# Patient Record
Sex: Female | Born: 1955 | Race: Black or African American | Hispanic: No | Marital: Single | Smoking: Former smoker
Health system: Southern US, Community
[De-identification: ages and names within clinical notes are randomized; demographics above are authoritative.]

## PROBLEM LIST (undated history)

## (undated) DIAGNOSIS — G709 Myoneural disorder, unspecified: Secondary | ICD-10-CM

## (undated) DIAGNOSIS — T7840XA Allergy, unspecified, initial encounter: Secondary | ICD-10-CM

## (undated) DIAGNOSIS — I10 Essential (primary) hypertension: Secondary | ICD-10-CM

## (undated) DIAGNOSIS — E785 Hyperlipidemia, unspecified: Secondary | ICD-10-CM

## (undated) DIAGNOSIS — K219 Gastro-esophageal reflux disease without esophagitis: Secondary | ICD-10-CM

## (undated) HISTORY — DX: Myoneural disorder, unspecified: G70.9

## (undated) HISTORY — DX: Gastro-esophageal reflux disease without esophagitis: K21.9

## (undated) HISTORY — DX: Essential (primary) hypertension: I10

## (undated) HISTORY — DX: Allergy, unspecified, initial encounter: T78.40XA

## (undated) HISTORY — DX: Hyperlipidemia, unspecified: E78.5

---
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
## 1980-10-03 HISTORY — PX: ABDOMINAL HYSTERECTOMY: SHX81

## 1997-10-03 HISTORY — PX: UPPER GASTROINTESTINAL ENDOSCOPY: SHX188

## 1998-09-08 ENCOUNTER — Ambulatory Visit (HOSPITAL_COMMUNITY): Admission: RE | Admit: 1998-09-08 | Discharge: 1998-09-08 | Payer: Self-pay | Admitting: Internal Medicine

## 1998-09-08 ENCOUNTER — Encounter: Payer: Self-pay | Admitting: Internal Medicine

## 1998-09-16 ENCOUNTER — Ambulatory Visit (HOSPITAL_COMMUNITY): Admission: RE | Admit: 1998-09-16 | Discharge: 1998-09-16 | Payer: Self-pay | Admitting: Internal Medicine

## 1998-10-07 ENCOUNTER — Emergency Department (HOSPITAL_COMMUNITY): Admission: EM | Admit: 1998-10-07 | Discharge: 1998-10-07 | Payer: Self-pay | Admitting: Emergency Medicine

## 2003-10-04 HISTORY — PX: UPPER GASTROINTESTINAL ENDOSCOPY: SHX188

## 2005-01-07 ENCOUNTER — Ambulatory Visit: Payer: Self-pay | Admitting: Family Medicine

## 2005-02-17 ENCOUNTER — Ambulatory Visit: Payer: Self-pay | Admitting: Family Medicine

## 2005-02-24 ENCOUNTER — Ambulatory Visit: Payer: Self-pay | Admitting: Family Medicine

## 2005-04-21 ENCOUNTER — Ambulatory Visit: Payer: Self-pay | Admitting: Family Medicine

## 2005-05-12 ENCOUNTER — Encounter: Admission: RE | Admit: 2005-05-12 | Discharge: 2005-05-12 | Payer: Self-pay | Admitting: Sports Medicine

## 2006-11-30 DIAGNOSIS — I10 Essential (primary) hypertension: Secondary | ICD-10-CM

## 2006-11-30 DIAGNOSIS — K589 Irritable bowel syndrome without diarrhea: Secondary | ICD-10-CM | POA: Insufficient documentation

## 2007-01-28 ENCOUNTER — Emergency Department (HOSPITAL_COMMUNITY): Admission: EM | Admit: 2007-01-28 | Discharge: 2007-01-28 | Payer: Self-pay | Admitting: Emergency Medicine

## 2007-08-09 ENCOUNTER — Encounter: Admission: RE | Admit: 2007-08-09 | Discharge: 2007-08-09 | Payer: Self-pay | Admitting: Internal Medicine

## 2007-11-01 ENCOUNTER — Encounter: Admission: RE | Admit: 2007-11-01 | Discharge: 2007-11-01 | Payer: Self-pay | Admitting: Internal Medicine

## 2008-02-14 ENCOUNTER — Encounter: Admission: RE | Admit: 2008-02-14 | Discharge: 2008-02-14 | Payer: Self-pay | Admitting: Internal Medicine

## 2008-02-22 ENCOUNTER — Encounter: Admission: RE | Admit: 2008-02-22 | Discharge: 2008-02-22 | Payer: Self-pay | Admitting: Internal Medicine

## 2009-02-19 ENCOUNTER — Encounter: Admission: RE | Admit: 2009-02-19 | Discharge: 2009-02-19 | Payer: Self-pay | Admitting: Internal Medicine

## 2010-02-07 ENCOUNTER — Emergency Department (HOSPITAL_COMMUNITY): Admission: EM | Admit: 2010-02-07 | Discharge: 2010-02-07 | Payer: Self-pay | Admitting: Emergency Medicine

## 2010-10-03 DIAGNOSIS — G709 Myoneural disorder, unspecified: Secondary | ICD-10-CM

## 2010-10-03 HISTORY — DX: Myoneural disorder, unspecified: G70.9

## 2010-10-24 ENCOUNTER — Encounter: Payer: Self-pay | Admitting: Internal Medicine

## 2010-11-29 ENCOUNTER — Emergency Department (HOSPITAL_COMMUNITY)
Admission: EM | Admit: 2010-11-29 | Discharge: 2010-11-29 | Disposition: A | Discharge status: Home / Self Care | Payer: Self-pay | Attending: Emergency Medicine | Admitting: Emergency Medicine

## 2010-11-29 DIAGNOSIS — E119 Type 2 diabetes mellitus without complications: Secondary | ICD-10-CM | POA: Insufficient documentation

## 2010-11-29 DIAGNOSIS — Z79899 Other long term (current) drug therapy: Secondary | ICD-10-CM | POA: Insufficient documentation

## 2010-11-29 DIAGNOSIS — M5430 Sciatica, unspecified side: Secondary | ICD-10-CM | POA: Insufficient documentation

## 2010-11-29 DIAGNOSIS — I10 Essential (primary) hypertension: Secondary | ICD-10-CM | POA: Insufficient documentation

## 2010-11-29 LAB — GLUCOSE, CAPILLARY: Glucose-Capillary: 127 mg/dL — ABNORMAL HIGH (ref 70–99)

## 2010-12-21 LAB — RAPID STREP SCREEN (MED CTR MEBANE ONLY): Streptococcus, Group A Screen (Direct): NEGATIVE

## 2011-03-16 ENCOUNTER — Other Ambulatory Visit (HOSPITAL_COMMUNITY): Payer: Self-pay | Admitting: Family Medicine

## 2011-03-16 DIAGNOSIS — Z1231 Encounter for screening mammogram for malignant neoplasm of breast: Secondary | ICD-10-CM

## 2011-03-16 DIAGNOSIS — Z780 Asymptomatic menopausal state: Secondary | ICD-10-CM

## 2011-03-29 ENCOUNTER — Ambulatory Visit (HOSPITAL_COMMUNITY)
Admission: RE | Admit: 2011-03-29 | Discharge: 2011-03-29 | Disposition: A | Discharge status: Home / Self Care | Payer: Self-pay | Source: Ambulatory Visit | Attending: Family Medicine | Admitting: Family Medicine

## 2011-03-29 DIAGNOSIS — Z1231 Encounter for screening mammogram for malignant neoplasm of breast: Secondary | ICD-10-CM | POA: Insufficient documentation

## 2011-03-29 DIAGNOSIS — Z780 Asymptomatic menopausal state: Secondary | ICD-10-CM

## 2011-06-08 ENCOUNTER — Telehealth: Payer: Self-pay | Admitting: Internal Medicine

## 2011-06-08 ENCOUNTER — Ambulatory Visit (AMBULATORY_SURGERY_CENTER): Payer: Self-pay | Admitting: *Deleted

## 2011-06-08 VITALS — Ht 62.0 in | Wt 136.0 lb

## 2011-06-08 DIAGNOSIS — Z1211 Encounter for screening for malignant neoplasm of colon: Secondary | ICD-10-CM

## 2011-06-08 NOTE — Progress Notes (Signed)
Addended by: Sarina Ill ANN on: 06/08/2011 05:50 PM   Modules accepted: Orders

## 2011-06-21 ENCOUNTER — Other Ambulatory Visit: Payer: Self-pay | Admitting: Internal Medicine

## 2011-08-08 NOTE — Telephone Encounter (Signed)
Done Doristine Church reviewed meds

## 2012-10-15 ENCOUNTER — Other Ambulatory Visit: Payer: Self-pay | Admitting: Internal Medicine

## 2012-10-15 ENCOUNTER — Ambulatory Visit
Admission: RE | Admit: 2012-10-15 | Discharge: 2012-10-15 | Disposition: A | Discharge status: Home / Self Care | Payer: BC Managed Care – PPO | Source: Ambulatory Visit | Attending: Internal Medicine | Admitting: Internal Medicine

## 2012-10-15 DIAGNOSIS — M79673 Pain in unspecified foot: Secondary | ICD-10-CM

## 2013-01-14 ENCOUNTER — Other Ambulatory Visit: Payer: Self-pay | Admitting: Internal Medicine

## 2013-01-14 ENCOUNTER — Ambulatory Visit
Admission: RE | Admit: 2013-01-14 | Discharge: 2013-01-14 | Disposition: A | Discharge status: Home / Self Care | Payer: BC Managed Care – PPO | Source: Ambulatory Visit | Attending: Internal Medicine | Admitting: Internal Medicine

## 2013-01-14 DIAGNOSIS — R05 Cough: Secondary | ICD-10-CM

## 2013-03-13 ENCOUNTER — Emergency Department (HOSPITAL_COMMUNITY): Payer: BC Managed Care – PPO

## 2013-03-13 ENCOUNTER — Encounter (HOSPITAL_COMMUNITY): Payer: Self-pay | Admitting: Physical Medicine and Rehabilitation

## 2013-03-13 ENCOUNTER — Emergency Department (HOSPITAL_COMMUNITY)
Admission: EM | Admit: 2013-03-13 | Discharge: 2013-03-13 | Disposition: A | Discharge status: Home / Self Care | Payer: BC Managed Care – PPO | Attending: Emergency Medicine | Admitting: Emergency Medicine

## 2013-03-13 DIAGNOSIS — E119 Type 2 diabetes mellitus without complications: Secondary | ICD-10-CM | POA: Insufficient documentation

## 2013-03-13 DIAGNOSIS — Z79899 Other long term (current) drug therapy: Secondary | ICD-10-CM | POA: Insufficient documentation

## 2013-03-13 DIAGNOSIS — E785 Hyperlipidemia, unspecified: Secondary | ICD-10-CM | POA: Insufficient documentation

## 2013-03-13 DIAGNOSIS — S298XXA Other specified injuries of thorax, initial encounter: Secondary | ICD-10-CM | POA: Insufficient documentation

## 2013-03-13 DIAGNOSIS — IMO0002 Reserved for concepts with insufficient information to code with codable children: Secondary | ICD-10-CM | POA: Insufficient documentation

## 2013-03-13 DIAGNOSIS — Y9389 Activity, other specified: Secondary | ICD-10-CM | POA: Insufficient documentation

## 2013-03-13 DIAGNOSIS — R0789 Other chest pain: Secondary | ICD-10-CM

## 2013-03-13 DIAGNOSIS — I10 Essential (primary) hypertension: Secondary | ICD-10-CM | POA: Insufficient documentation

## 2013-03-13 DIAGNOSIS — Z87891 Personal history of nicotine dependence: Secondary | ICD-10-CM | POA: Insufficient documentation

## 2013-03-13 DIAGNOSIS — T148XXA Other injury of unspecified body region, initial encounter: Secondary | ICD-10-CM

## 2013-03-13 DIAGNOSIS — Y92410 Unspecified street and highway as the place of occurrence of the external cause: Secondary | ICD-10-CM | POA: Insufficient documentation

## 2013-03-13 DIAGNOSIS — Z8669 Personal history of other diseases of the nervous system and sense organs: Secondary | ICD-10-CM | POA: Insufficient documentation

## 2013-03-13 MED ORDER — IBUPROFEN 800 MG PO TABS: 800.0000 mg | ORAL_TABLET | Freq: Three times a day (TID) | ORAL | Status: DC

## 2013-03-13 MED ORDER — METHOCARBAMOL 500 MG PO TABS: 500.0000 mg | ORAL_TABLET | Freq: Two times a day (BID) | ORAL | Status: DC
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
## 2013-03-13 NOTE — ED Provider Notes (Signed)
  Medical screening examination/treatment/procedure(s) were performed by non-physician practitioner and as supervising physician I was immediately available for consultation/collaboration.   Gerhard Munch, MD 03/13/13 [phone]
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
## 2013-08-12 ENCOUNTER — Ambulatory Visit
Admission: RE | Admit: 2013-08-12 | Discharge: 2013-08-12 | Disposition: A | Discharge status: Home / Self Care | Payer: BC Managed Care – PPO | Source: Ambulatory Visit | Attending: Internal Medicine | Admitting: Internal Medicine

## 2013-08-12 ENCOUNTER — Other Ambulatory Visit: Payer: Self-pay | Admitting: Internal Medicine

## 2015-02-13 ENCOUNTER — Other Ambulatory Visit (HOSPITAL_COMMUNITY): Payer: Self-pay | Admitting: Internal Medicine

## 2015-02-13 DIAGNOSIS — E0591 Thyrotoxicosis, unspecified with thyrotoxic crisis or storm: Secondary | ICD-10-CM

## 2015-03-09 ENCOUNTER — Encounter (HOSPITAL_COMMUNITY)
Admission: RE | Admit: 2015-03-09 | Discharge: 2015-03-09 | Disposition: A | Discharge status: Home / Self Care | Payer: BLUE CROSS/BLUE SHIELD | Source: Ambulatory Visit | Attending: Internal Medicine | Admitting: Internal Medicine

## 2015-03-09 DIAGNOSIS — E0590 Thyrotoxicosis, unspecified without thyrotoxic crisis or storm: Secondary | ICD-10-CM | POA: Insufficient documentation

## 2015-03-09 DIAGNOSIS — E0591 Thyrotoxicosis, unspecified with thyrotoxic crisis or storm: Secondary | ICD-10-CM

## 2015-03-09 MED ORDER — SODIUM IODIDE I 131 CAPSULE
15.0000 | Freq: Once | INTRAVENOUS | Status: AC | PRN
  Administered 2015-03-09: 15 via ORAL

## 2015-03-10 ENCOUNTER — Encounter (HOSPITAL_COMMUNITY)
Admission: RE | Admit: 2015-03-10 | Discharge: 2015-03-10 | Disposition: A | Discharge status: Home / Self Care | Payer: BLUE CROSS/BLUE SHIELD | Source: Ambulatory Visit | Attending: Internal Medicine | Admitting: Internal Medicine

## 2015-03-10 DIAGNOSIS — E0590 Thyrotoxicosis, unspecified without thyrotoxic crisis or storm: Secondary | ICD-10-CM | POA: Diagnosis not present

## 2015-03-10 MED ORDER — SODIUM PERTECHNETATE TC 99M INJECTION
10.0000 | Freq: Once | INTRAVENOUS | Status: AC | PRN
  Administered 2015-03-10: 10 via INTRAVENOUS

## 2015-09-02 ENCOUNTER — Other Ambulatory Visit: Payer: Self-pay

## 2015-09-02 DIAGNOSIS — Z1231 Encounter for screening mammogram for malignant neoplasm of breast: Secondary | ICD-10-CM

## 2015-09-07 ENCOUNTER — Encounter: Payer: Self-pay | Admitting: Gastroenterology

## 2015-09-24 ENCOUNTER — Ambulatory Visit: Payer: BLUE CROSS/BLUE SHIELD

## 2015-10-12 ENCOUNTER — Ambulatory Visit: Payer: BLUE CROSS/BLUE SHIELD

## 2015-11-03 ENCOUNTER — Encounter: Payer: BLUE CROSS/BLUE SHIELD | Admitting: Gastroenterology

## 2015-11-09 ENCOUNTER — Ambulatory Visit
Admission: RE | Admit: 2015-11-09 | Discharge: 2015-11-09 | Disposition: A | Discharge status: Home / Self Care | Payer: BLUE CROSS/BLUE SHIELD | Source: Ambulatory Visit

## 2015-11-09 DIAGNOSIS — Z1231 Encounter for screening mammogram for malignant neoplasm of breast: Secondary | ICD-10-CM

## 2015-11-16 ENCOUNTER — Ambulatory Visit (AMBULATORY_SURGERY_CENTER): Payer: Self-pay

## 2015-11-16 VITALS — Ht 62.0 in | Wt 123.0 lb

## 2015-11-16 DIAGNOSIS — Z1211 Encounter for screening for malignant neoplasm of colon: Secondary | ICD-10-CM

## 2015-11-16 MED ORDER — NA SULFATE-K SULFATE-MG SULF 17.5-3.13-1.6 GM/177ML PO SOLN: 1.0000 | Freq: Once | ORAL | Status: DC

## 2015-11-16 NOTE — Progress Notes (Signed)
No egg or soy allergies Not on home 02 No previous anesthesia complications No diet or weight loss meds 

## 2015-11-17 ENCOUNTER — Encounter: Payer: Self-pay | Admitting: Internal Medicine

## 2015-12-01 ENCOUNTER — Encounter: Payer: Self-pay | Admitting: Gastroenterology

## 2015-12-01 ENCOUNTER — Ambulatory Visit (AMBULATORY_SURGERY_CENTER): Payer: BLUE CROSS/BLUE SHIELD | Admitting: Gastroenterology

## 2015-12-01 VITALS — BP 144/86 | HR 88 | Temp 97.1°F | Resp 18 | Ht 62.0 in | Wt 123.0 lb

## 2015-12-01 DIAGNOSIS — D125 Benign neoplasm of sigmoid colon: Secondary | ICD-10-CM | POA: Diagnosis not present

## 2015-12-01 DIAGNOSIS — Z1211 Encounter for screening for malignant neoplasm of colon: Secondary | ICD-10-CM | POA: Diagnosis not present

## 2015-12-01 DIAGNOSIS — K635 Polyp of colon: Secondary | ICD-10-CM | POA: Diagnosis not present

## 2015-12-01 DIAGNOSIS — D123 Benign neoplasm of transverse colon: Secondary | ICD-10-CM

## 2015-12-01 HISTORY — PX: COLONOSCOPY: SHX174

## 2015-12-01 LAB — GLUCOSE, CAPILLARY
Glucose-Capillary: 77 mg/dL (ref 65–99)
Glucose-Capillary: 138 mg/dL — ABNORMAL HIGH (ref 65–99)
Glucose-Capillary: 87 mg/dL (ref 65–99)

## 2015-12-01 MED ORDER — SODIUM CHLORIDE 0.9 % IV SOLN: 500.0000 mL | INTRAVENOUS | Status: DC

## 2015-12-01 NOTE — Op Note (Signed)
[facility]  [address]. [place], [address]   COLONOSCOPY PROCEDURE REPORT  PATIENT: Rebecca, Glover  MR#: [id number] BIRTHDATE: 04-Feb-1956 , 85  yrs. old GENDER: female ENDOSCOPIST: Harl Bowie, MD REFERRED [id number] Rollene Rotunda MD PROCEDURE DATE:  12/01/2015 PROCEDURE:   Colonoscopy, screening, Colonoscopy with snare polypectomy, and Colonoscopy with cold biopsy polypectomy First Screening Colonoscopy - Avg.  risk and is 50 yrs.  old or older Yes.  Prior Negative Screening - Now for repeat screening. N/A  History of Adenoma - Now for follow-up colonoscopy & has been > or = to 3 yrs.  N/A  Polyps removed today? Yes ASA CLASS:   Class II INDICATIONS:Screening for colonic neoplasia and Colorectal Neoplasm Risk Assessment for this procedure is average risk. MEDICATIONS: Propofol 200 mg IV  DESCRIPTION OF PROCEDURE:   After the risks benefits and alternatives of the procedure were thoroughly explained, informed consent was obtained.  The digital rectal exam revealed no abnormalities of the rectum.   The LB [id number]  endoscope was introduced through the anus and advanced to the cecum, which was identified by both the appendix and ileocecal valve. No adverse events experienced.   The quality of the prep was good.  The instrument was then slowly withdrawn as the colon was fully examined. Estimated blood loss is zero unless otherwise noted in this procedure report.  COLON FINDINGS: A sessile polyp ranging between 3-53mm in size was found at the hepatic flexure.  A polypectomy was performed with cold forceps.  The resection was complete, the polyp tissue was completely retrieved and sent to histology.   A sessile polyp ranging between 5-74mm in size was found in the sigmoid colon.  A polypectomy was performed with a cold snare.  The resection was complete, the polyp tissue was completely retrieved and sent to histology.   There was mild diverticulosis noted  throughout the entire examined colon.  Retroflexed views revealed internal hemorrhoids. The time to cecum = 5.1 Withdrawal time = 13.4   The scope was withdrawn and the procedure completed. COMPLICATIONS: There were no immediate complications.  ENDOSCOPIC IMPRESSION: 1.   Sessile polyp ranging between 3-39mm in size was found at the hepatic flexure; polypectomy was performed with cold forceps 2.   Sessile polyp ranging between 5-72mm in size was found in the sigmoid colon; polypectomy was performed with a cold snare 3.   Mild diverticulosis was noted throughout the entire examined colon  RECOMMENDATIONS: 1.  Await pathology results 2.  If the polyp(s) removed today are proven to be adenomatous (pre-cancerous) polyps, you will need a repeat colonoscopy in 5 years.  Otherwise you should continue to follow colorectal cancer screening guidelines for "routine risk" patients with colonoscopy in 10 years.  You will receive a letter within 1-2 weeks with the results of your biopsy as well as final recommendations.  Please call my office if you have not received a letter after 3 weeks. eSigned:  Harl Bowie, MD 12/01/2015 2:31 PM      PATIENT NAME:  Rebecca, Glover MR#: [id number]

## 2015-12-01 NOTE — Patient Instructions (Signed)
YOU HAD AN ENDOSCOPIC PROCEDURE TODAY AT [facility]:   Refer to the procedure report that was given to you for any specific questions about what was found during the examination.  If the procedure report does not answer your questions, please call your gastroenterologist to clarify.  If you requested that your care partner not be given the details of your procedure findings, then the procedure report has been included in a sealed envelope for you to review at your convenience later.  YOU SHOULD EXPECT: Some feelings of bloating in the abdomen. Passage of more gas than usual.  Walking can help get rid of the air that was put into your GI tract during the procedure and reduce the bloating. If you had a lower endoscopy (such as a colonoscopy or flexible sigmoidoscopy) you may notice spotting of blood in your stool or on the toilet paper. If you underwent a bowel prep for your procedure, you may not have a normal bowel movement for a few days.  Please Note:  You might notice some irritation and congestion in your nose or some drainage.  This is from the oxygen used during your procedure.  There is no need for concern and it should clear up in a day or so.  SYMPTOMS TO REPORT IMMEDIATELY:   Following lower endoscopy (colonoscopy or flexible sigmoidoscopy):  Excessive amounts of blood in the stool  Significant tenderness or worsening of abdominal pains  Swelling of the abdomen that is new, acute  Fever of 100F or higher   For urgent or emergent issues, a gastroenterologist can be reached at any hour by calling [phone].   DIET: Your first meal following the procedure should be a small meal and then it is ok to progress to your normal diet. Heavy or fried foods are harder to digest and may make you feel nauseous or bloated.  Likewise, meals heavy in dairy and vegetables can increase bloating.  Drink plenty of fluids but you should avoid alcoholic beverages for 24  hours.  ACTIVITY:  You should plan to take it easy for the rest of today and you should NOT DRIVE or use heavy machinery until tomorrow (because of the sedation medicines used during the test).    FOLLOW UP: Our staff will call the number listed on your records the next business day following your procedure to check on you and address any questions or concerns that you may have regarding the information given to you following your procedure. If we do not reach you, we will leave a message.  However, if you are feeling well and you are not experiencing any problems, there is no need to return our call.  We will assume that you have returned to your regular daily activities without incident.  If any biopsies were taken you will be contacted by phone or by letter within the next 1-3 weeks.  Please call us at [phone] if you have not heard about the biopsies in 3 weeks.    SIGNATURES/CONFIDENTIALITY: You and/or your care partner have signed paperwork which will be entered into your electronic medical record.  These signatures attest to the fact that that the information above on your After Visit Summary has been reviewed and is understood.  Full responsibility of the confidentiality of this discharge information lies with you and/or your care-partner.  Polyp/ diverticulosis handout given Await pathology results Resume medications and diet today

## 2015-12-01 NOTE — Progress Notes (Signed)
Patient's blood glucose on admission 77. Only symptom was a headache. Iv started and D5W started. RE checked blood glucose after 15 minutes blood sugar 138. D5W stopped and NS at [facility]. Patient stating her headache is improved.

## 2015-12-01 NOTE — Progress Notes (Signed)
Called to room to assist during endoscopic procedure.  Patient ID and intended procedure confirmed with present staff. Received instructions for my participation in the procedure from the performing physician.  

## 2015-12-01 NOTE — Progress Notes (Signed)
A/ox3 pleased with MAC, report to Jill RN 

## 2015-12-02 ENCOUNTER — Telehealth: Payer: Self-pay

## 2015-12-02 NOTE — Telephone Encounter (Signed)
  Follow up Call-  Call back number 12/01/2015  Post procedure Call Back phone  # [phone]  Permission to leave phone message Yes     Patient questions:  Do you have a fever, pain , or abdominal swelling? No. Pain Score  0 *  Have you tolerated food without any problems? Yes.    Have you been able to return to your normal activities? Yes.    Do you have any questions about your discharge instructions: Diet   No. Medications  No. Follow up visit  No.  Do you have questions or concerns about your Care? No.  Actions: * If pain score is 4 or above: No action needed, pain <4.

## 2015-12-08 ENCOUNTER — Encounter: Payer: Self-pay | Admitting: Gastroenterology

## 2016-11-10 ENCOUNTER — Other Ambulatory Visit: Payer: Self-pay | Admitting: Internal Medicine

## 2016-11-10 DIAGNOSIS — Z1231 Encounter for screening mammogram for malignant neoplasm of breast: Secondary | ICD-10-CM

## 2016-11-21 ENCOUNTER — Ambulatory Visit
Admission: RE | Admit: 2016-11-21 | Discharge: 2016-11-21 | Disposition: A | Discharge status: Home / Self Care | Payer: BLUE CROSS/BLUE SHIELD | Source: Ambulatory Visit | Attending: Internal Medicine | Admitting: Internal Medicine

## 2016-11-21 DIAGNOSIS — Z1231 Encounter for screening mammogram for malignant neoplasm of breast: Secondary | ICD-10-CM

## 2017-11-30 ENCOUNTER — Other Ambulatory Visit: Payer: Self-pay | Admitting: Internal Medicine

## 2017-11-30 DIAGNOSIS — Z1231 Encounter for screening mammogram for malignant neoplasm of breast: Secondary | ICD-10-CM

## 2017-12-20 ENCOUNTER — Ambulatory Visit
Admission: RE | Admit: 2017-12-20 | Discharge: 2017-12-20 | Disposition: A | Discharge status: Home / Self Care | Payer: BLUE CROSS/BLUE SHIELD | Source: Ambulatory Visit | Attending: Internal Medicine | Admitting: Internal Medicine

## 2017-12-20 DIAGNOSIS — Z1231 Encounter for screening mammogram for malignant neoplasm of breast: Secondary | ICD-10-CM

## 2018-11-14 ENCOUNTER — Other Ambulatory Visit: Payer: Self-pay | Admitting: Internal Medicine

## 2018-11-14 DIAGNOSIS — Z1231 Encounter for screening mammogram for malignant neoplasm of breast: Secondary | ICD-10-CM

## 2019-01-07 ENCOUNTER — Ambulatory Visit: Payer: BLUE CROSS/BLUE SHIELD

## 2019-02-11 ENCOUNTER — Ambulatory Visit
Admission: RE | Admit: 2019-02-11 | Discharge: 2019-02-11 | Disposition: A | Discharge status: Home / Self Care | Payer: BLUE CROSS/BLUE SHIELD | Source: Ambulatory Visit | Attending: Internal Medicine | Admitting: Internal Medicine

## 2019-02-11 ENCOUNTER — Other Ambulatory Visit: Payer: Self-pay

## 2019-02-11 DIAGNOSIS — Z1231 Encounter for screening mammogram for malignant neoplasm of breast: Secondary | ICD-10-CM

## 2019-02-18 ENCOUNTER — Ambulatory Visit: Payer: BLUE CROSS/BLUE SHIELD

## 2020-01-20 ENCOUNTER — Other Ambulatory Visit: Payer: Self-pay | Admitting: Internal Medicine

## 2020-01-20 DIAGNOSIS — Z1231 Encounter for screening mammogram for malignant neoplasm of breast: Secondary | ICD-10-CM

## 2020-02-17 ENCOUNTER — Ambulatory Visit: Payer: BLUE CROSS/BLUE SHIELD

## 2021-03-20 ENCOUNTER — Emergency Department (HOSPITAL_COMMUNITY): Payer: Medicare Other

## 2021-03-20 ENCOUNTER — Emergency Department (HOSPITAL_COMMUNITY)
Admission: EM | Admit: 2021-03-20 | Discharge: 2021-03-20 | Disposition: A | Discharge status: Home / Self Care | Payer: Medicare Other | Attending: Emergency Medicine | Admitting: Emergency Medicine

## 2021-03-20 ENCOUNTER — Encounter (HOSPITAL_COMMUNITY): Payer: Self-pay

## 2021-03-20 ENCOUNTER — Other Ambulatory Visit: Payer: Self-pay

## 2021-03-20 DIAGNOSIS — S99192A Other physeal fracture of left metatarsal, initial encounter for closed fracture: Secondary | ICD-10-CM

## 2021-03-20 DIAGNOSIS — Z79899 Other long term (current) drug therapy: Secondary | ICD-10-CM | POA: Diagnosis not present

## 2021-03-20 DIAGNOSIS — X501XXA Overexertion from prolonged static or awkward postures, initial encounter: Secondary | ICD-10-CM | POA: Insufficient documentation

## 2021-03-20 DIAGNOSIS — S92352A Displaced fracture of fifth metatarsal bone, left foot, initial encounter for closed fracture: Secondary | ICD-10-CM | POA: Diagnosis not present

## 2021-03-20 DIAGNOSIS — Z87891 Personal history of nicotine dependence: Secondary | ICD-10-CM | POA: Diagnosis not present

## 2021-03-20 DIAGNOSIS — S99922A Unspecified injury of left foot, initial encounter: Secondary | ICD-10-CM | POA: Diagnosis present

## 2021-03-20 DIAGNOSIS — E119 Type 2 diabetes mellitus without complications: Secondary | ICD-10-CM | POA: Insufficient documentation

## 2021-03-20 DIAGNOSIS — I10 Essential (primary) hypertension: Secondary | ICD-10-CM | POA: Diagnosis not present

## 2021-03-20 DIAGNOSIS — Z7984 Long term (current) use of oral hypoglycemic drugs: Secondary | ICD-10-CM | POA: Insufficient documentation

## 2021-03-20 MED ORDER — ACETAMINOPHEN 500 MG PO TABS: 500.0000 mg | ORAL_TABLET | Freq: Four times a day (QID) | ORAL | 0 refills | Status: DC

## 2021-03-20 MED ORDER — IBUPROFEN 600 MG PO TABS: 600.0000 mg | ORAL_TABLET | Freq: Three times a day (TID) | ORAL | 0 refills | Status: DC | PRN

## 2021-03-20 NOTE — ED Notes (Addendum)
RN called ortho for CAM boot and crutches

## 2021-03-20 NOTE — Progress Notes (Signed)
Orthopedic Tech Progress Note Patient Details:  Rebecca Glover 01-Jul-1956 [id number]  Ortho Devices Type of Ortho Device: Crutches, CAM walker Ortho Device/Splint Location: RLE Ortho Device/Splint Interventions: Ordered, Application   Post Interventions Patient Tolerated: Well Instructions Provided: Adjustment of device  Germaine Pomfret 03/20/2021, 6:15 PM
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
## 2021-03-20 NOTE — ED Notes (Signed)
RN reviewed discharge instructions w/ pt. Follow up, pain management, and prescriptions reviewed. Pt had no further questions
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
## 2021-03-20 NOTE — ED Provider Notes (Signed)
[facility] EMERGENCY DEPARTMENT Provider Note   CSN: [id number] Arrival date & time: 03/20/21  1106     History No chief complaint on file.   Rebecca Glover is a 65 y.o. female.  The history is provided by the patient.  Foot Injury Location:  Foot Time since incident:  4 days Injury: yes   Mechanism of injury comment:  Twisted ankle while walking Foot location:  L foot Pain details:    Quality:  Aching, pressure and shooting   Radiates to:  Does not radiate   Severity:  Moderate   Onset quality:  Sudden   Duration:  4 days   Timing:  Constant   Progression:  Unchanged Chronicity:  New Dislocation: no   Foreign body present:  No foreign bodies Tetanus status:  Unknown Prior injury to area:  No Relieved by:  None tried Worsened by:  Bearing weight Ineffective treatments:  None tried Associated symptoms: stiffness and swelling   Associated symptoms: no back pain, no decreased ROM, no fatigue, no fever, no itching, no muscle weakness, no neck pain, no numbness and no tingling   Risk factors: no concern for non-accidental trauma, no frequent fractures, no known bone disorder, no obesity and no recent illness       Past Medical History:  Diagnosis Date   Allergy    Diabetes mellitus    Hyperlipidemia    Hypertension    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     Family History  Problem Relation Age of Onset   Diabetes Mother    Breast cancer Sister 79   Colon cancer Neg Hx     Social History   Tobacco Use   Smoking status: Former     Pack years: 0.00   Smokeless tobacco: Never  Substance Use Topics   Alcohol use: No    Alcohol/week: 0.0 standard drinks   Drug use: No    Home Medications Prior to Admission medications   Medication Sig Start Date End Date Taking? Authorizing Provider  acetaminophen (TYLENOL) 500 MG tablet Take 1 tablet (500 mg total) by mouth every 6 (six) hours. 03/20/21  Yes Pearson Grippe, DO  Aspirin-Salicylamide-Caffeine (BC HEADACHE POWDER PO) Take 1 packet by mouth daily as needed (headaches).    [provider]  ibuprofen (ADVIL) 600 MG tablet Take 1 tablet (600 mg total) by mouth every 8 (eight) hours as needed for moderate pain. 03/20/21   Pearson Grippe, DO  lisinopril (PRINIVIL,ZESTRIL) 10 MG tablet Take 10 mg by mouth daily.    [provider]  metFORMIN (GLUCOPHAGE) 850 MG tablet Take 850 mg by mouth daily.      [provider]  pravastatin (PRAVACHOL) 40 MG tablet Take 40 mg by mouth daily.      [provider]    Allergies    Codeine  Review of Systems   Review of Systems  Constitutional:  Negative for chills, fatigue and fever.  HENT:  Negative for ear pain and sore throat.   Eyes:  Negative for pain and visual disturbance.  Respiratory:  Negative for cough and shortness of breath.   Cardiovascular:  Negative for chest pain and palpitations.  Gastrointestinal:  Negative for abdominal pain and vomiting.  Genitourinary:  Negative for dysuria and hematuria.  Musculoskeletal:  Positive for arthralgias (L foot) and stiffness. Negative for back pain and neck pain.  Skin:  Negative for color change, itching and rash.  Neurological:  Negative for seizures and syncope.  All other systems reviewed and are negative.  Physical Exam Updated Vital Signs BP (!) 181/108 (BP Location: Left Arm)   Pulse 80   Temp 98 F (36.7 C) (Oral)   Resp 17   SpO2 98%   Physical Exam Vitals and nursing note reviewed.  Constitutional:      General: She is not  in acute distress.    Appearance: She is well-developed.  HENT:     Head: Normocephalic and atraumatic.  Eyes:     Conjunctiva/sclera: Conjunctivae normal.  Cardiovascular:     Rate and Rhythm: Normal rate and regular rhythm.     Heart sounds: No murmur heard. Pulmonary:     Effort: Pulmonary effort is normal. No respiratory distress.     Breath sounds: Normal breath sounds.  Abdominal:     Palpations: Abdomen is soft.     Tenderness: There is no abdominal tenderness.  Musculoskeletal:     Cervical back: Neck supple.     Left foot: Tenderness (Lateral foot) and bony tenderness (lateral foot over 5th MT) present. No prominent metatarsal heads.     Comments:  No open wounds. L ankle atraumatic without TTP  Skin:    General: Skin is warm and dry.  Neurological:     Mental Status: She is alert.    ED Results / Procedures / Treatments   Labs (all labs ordered are listed, but only abnormal results are displayed) Labs Reviewed - No data to display  EKG None  Radiology DG Foot Complete Left  Result Date: 03/20/2021 CLINICAL DATA:  Fifth metatarsal pain. EXAM: LEFT FOOT - COMPLETE 3+ VIEW COMPARISON:  None. FINDINGS: Minimally displaced fracture through the base of the fifth metatarsal bone. No significant angulation or displacement. Mild soft tissue swelling. IMPRESSION: Minimally displaced fracture through the base of the fifth metatarsal bone. Electronically Signed   By: Fidela Salisbury M.D.   On: 03/20/2021 12:32    Procedures Procedures   Medications Ordered in ED Medications - No data to display  ED Course  I have reviewed the triage vital signs and the nursing notes.  Pertinent labs & imaging results that were available during my care of the patient were reviewed by me and considered in my medical decision making (see chart for details).  Clinical Course as of 03/20/21 1759  Sat Mar 20, 2021  1736 This is a 65 year old female presented ED with a left foot  injury.  She injured her foot 4 days ago.  She has not been bearing much weight on it.  On exam she is neurovascularly intact.  There is no deformity of the foot.  X-ray shows a fracture at the base of the fifth metatarsal which is concerning for Jones fracture.  PA provider plans to discuss this with orthopedics to ensure close follow-up, and I anticipate she can be discharged with crutches and NWB & ortho f/u [MT]  1745 Spoke with ortho Victorino December) about the presentation and my assessment. He looked at her plain films. Recommends walking boot, partial NWB  and to be seen in clinic in 1 week.  [ZB]    Clinical Course User Index [MT] Wyvonnia Dusky, MD [ZB] Pearson Grippe, DO   MDM Rules/Calculators/A&P                          This is a 65 year old female with a history of well-controlled diabetes and hypertension who presented to the emergency department with a left foot injury that occurred 4 days ago and has since had left lateral foot pain particularly with ambulation. No open wounds.  Do not suspect open fracture. Exam is neurovascular intact left lower extremity.  Plain films consistent with Jones fracture. I spoke with orthopedic surgeon Victorino December.  Please see details in our discussion above. Placed in fracture boot.  Ortho recommended partial nonweightbearing.  Crutches were given. She was given information to call their clinic to schedule an appointment.  They recommended she be seen in approximately 1 week for follow-up.  Discussed pain management to include Tylenol, Motrin as needed as well as RICE therapy.  D/c in good condition  Final Clinical Impression(s) / ED Diagnoses Final diagnoses:  Closed fracture of base of fifth metatarsal bone of left foot at metaphyseal-diaphyseal junction, initial encounter    Rx / DC Orders ED Discharge Orders          Ordered    ibuprofen (ADVIL) 600 MG tablet  Every 8 hours PRN,   Status:  Discontinued        03/20/21 1757     acetaminophen (TYLENOL) 500 MG tablet  Every 6 hours        03/20/21 1757    ibuprofen (ADVIL) 600 MG tablet  Every 8 hours PRN        03/20/21 1757             Pearson Grippe, DO 03/20/21 1759    Wyvonnia Dusky, MD 03/21/21 1019

## 2021-03-20 NOTE — ED Triage Notes (Signed)
Patient twisted foot in flip flops on Thursday and has lateral pain to foot with bruising since that time.

## 2021-03-29 ENCOUNTER — Other Ambulatory Visit: Payer: Self-pay | Admitting: Internal Medicine

## 2021-03-29 DIAGNOSIS — Z1231 Encounter for screening mammogram for malignant neoplasm of breast: Secondary | ICD-10-CM

## 2021-05-17 ENCOUNTER — Encounter: Payer: Self-pay | Admitting: Gastroenterology

## 2021-05-20 ENCOUNTER — Ambulatory Visit
Admission: RE | Admit: 2021-05-20 | Discharge: 2021-05-20 | Disposition: A | Discharge status: Home / Self Care | Payer: Medicare Other | Source: Ambulatory Visit | Attending: Internal Medicine | Admitting: Internal Medicine

## 2021-05-20 ENCOUNTER — Other Ambulatory Visit: Payer: Self-pay

## 2021-05-20 DIAGNOSIS — Z1231 Encounter for screening mammogram for malignant neoplasm of breast: Secondary | ICD-10-CM

## 2021-07-21 ENCOUNTER — Other Ambulatory Visit: Payer: Self-pay | Admitting: Internal Medicine

## 2021-07-21 DIAGNOSIS — R1030 Lower abdominal pain, unspecified: Secondary | ICD-10-CM

## 2021-07-21 DIAGNOSIS — N939 Abnormal uterine and vaginal bleeding, unspecified: Secondary | ICD-10-CM

## 2021-08-04 ENCOUNTER — Ambulatory Visit
Admission: RE | Admit: 2021-08-04 | Discharge: 2021-08-04 | Disposition: A | Discharge status: Home / Self Care | Payer: Medicare Other | Source: Ambulatory Visit | Attending: Internal Medicine | Admitting: Internal Medicine

## 2021-08-04 DIAGNOSIS — N939 Abnormal uterine and vaginal bleeding, unspecified: Secondary | ICD-10-CM

## 2021-08-04 DIAGNOSIS — R1030 Lower abdominal pain, unspecified: Secondary | ICD-10-CM

## 2022-04-07 ENCOUNTER — Other Ambulatory Visit: Payer: Self-pay | Admitting: Internal Medicine

## 2022-04-07 DIAGNOSIS — Z1231 Encounter for screening mammogram for malignant neoplasm of breast: Secondary | ICD-10-CM

## 2022-04-15 DIAGNOSIS — Z23 Encounter for immunization: Secondary | ICD-10-CM | POA: Diagnosis not present

## 2022-05-11 IMAGING — MG MM DIGITAL SCREENING BILAT W/ TOMO AND CAD
8 series · 9 of 24 positions shown · non-contrast
Comparison: Previous exam(s).

CLINICAL DATA: Screening.

EXAM:
DIGITAL SCREENING BILATERAL MAMMOGRAM WITH TOMOSYNTHESIS AND CAD
TECHNIQUE: Bilateral screening digital craniocaudal and mediolateral oblique
mammograms were obtained. Bilateral screening digital breast
tomosynthesis was performed. The images were evaluated with
computer-aided detection.

[L CC synth-2D]
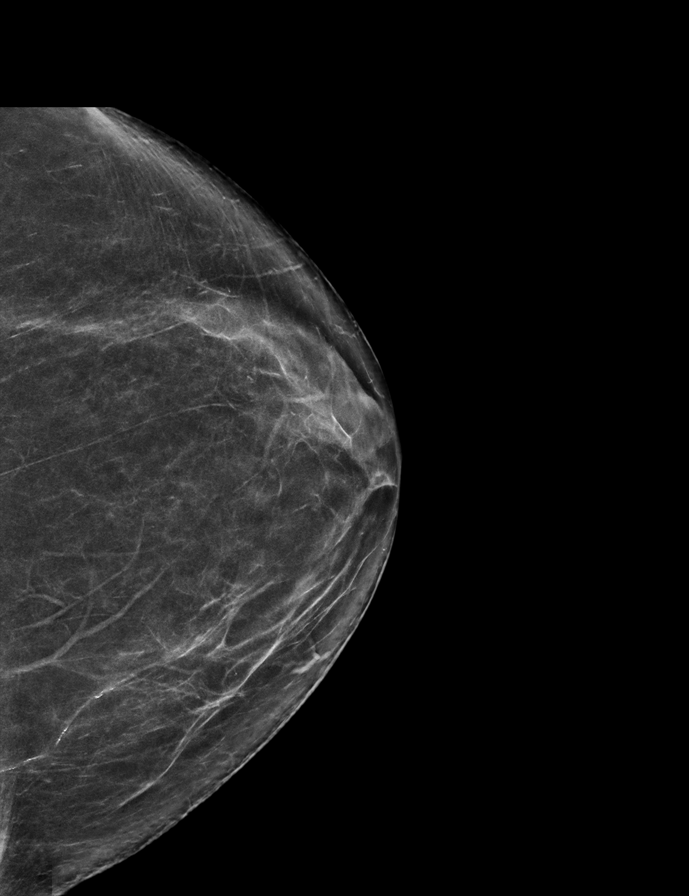

[L MLO synth-2D]
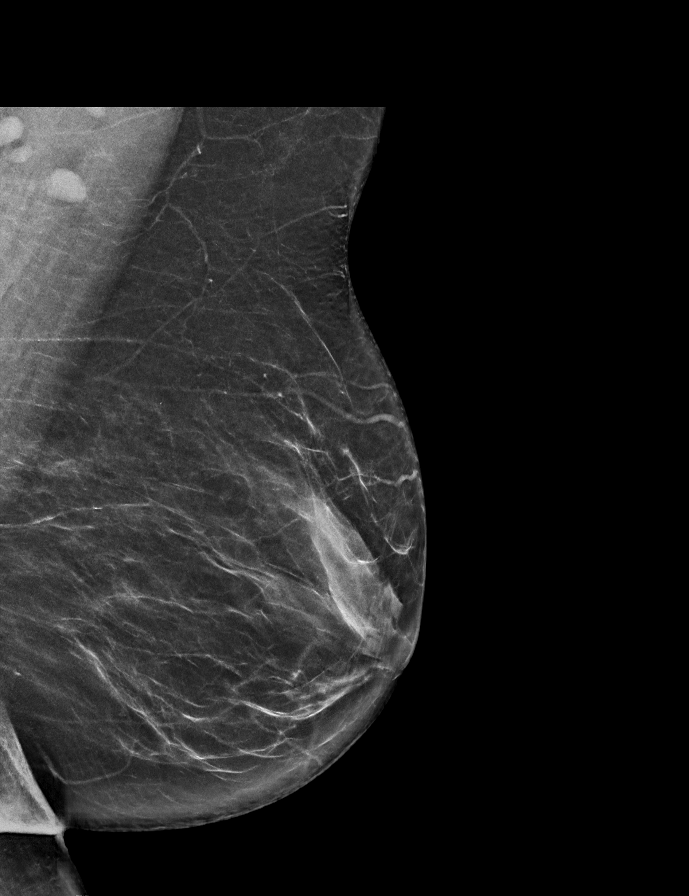

[R CC synth-2D]
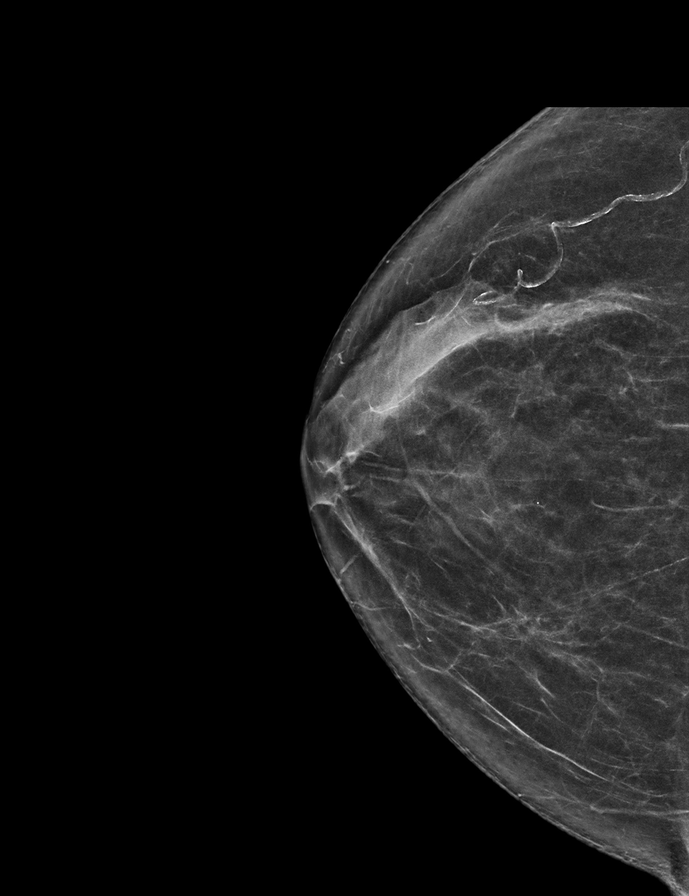

[R MLO synth-2D]
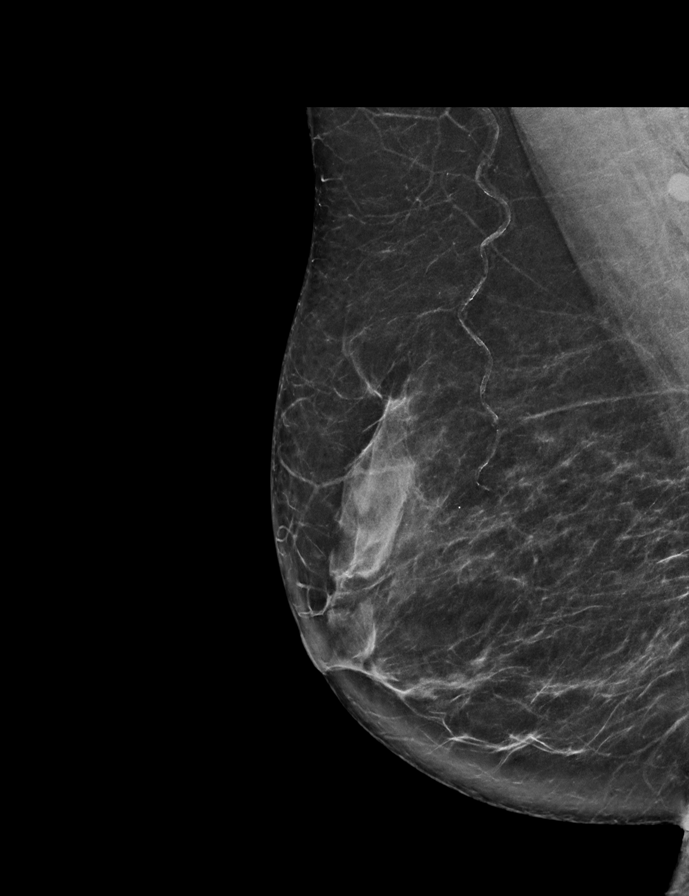

[L CC tomo · 2 of 70 frames shown]
[frame 23/70]
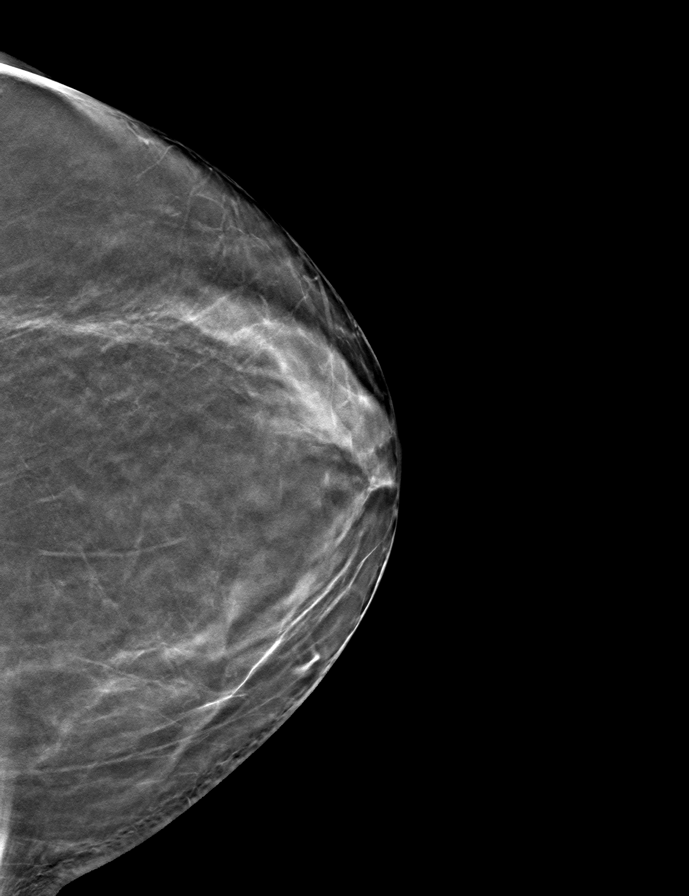
[frame 35/70]
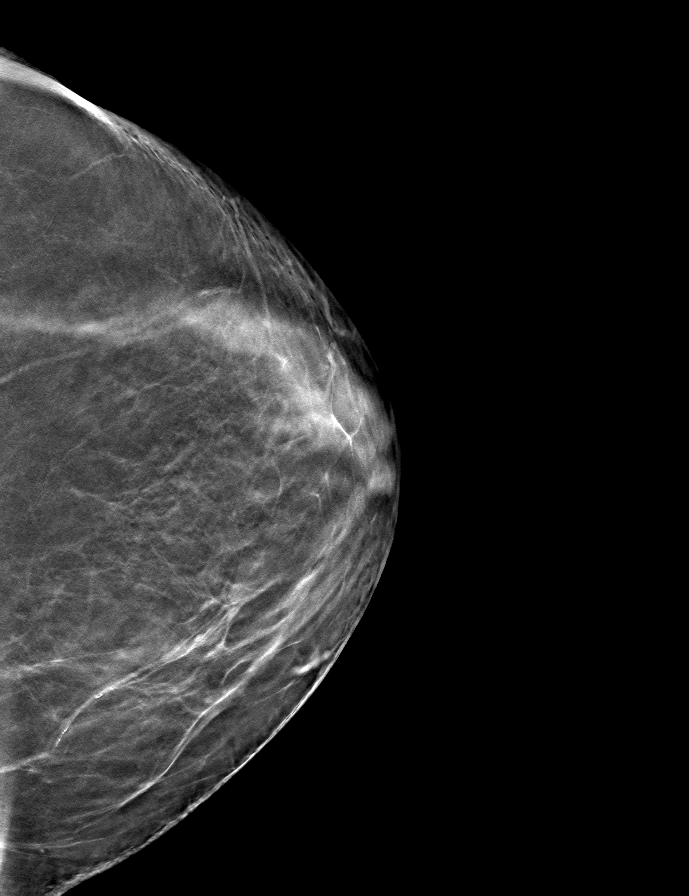

[R MLO tomo · tomo slice 35/70.0]
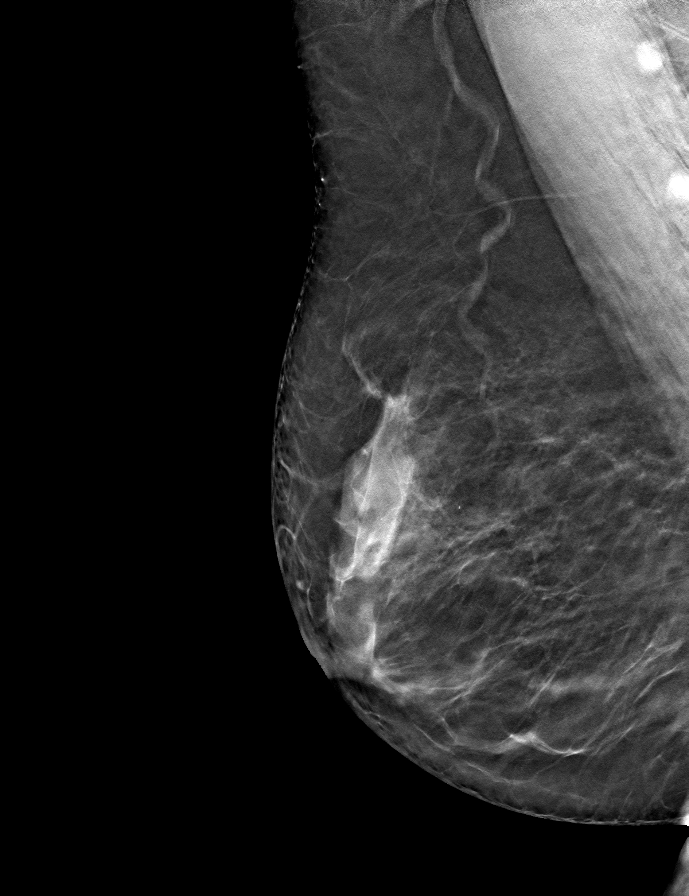

[L MLO tomo · tomo slice 36/71.0]
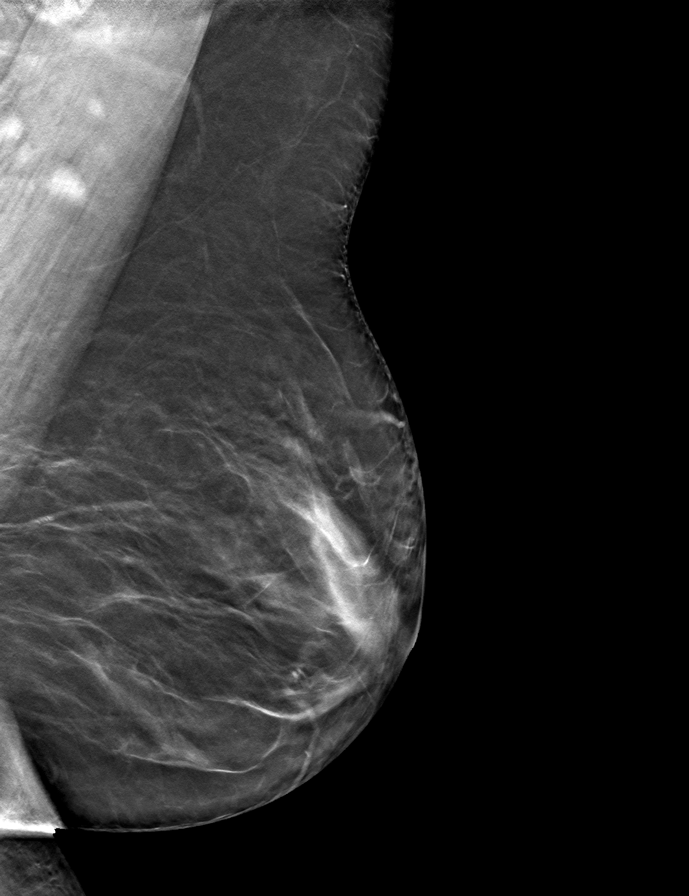

[R CC tomo · tomo slice 33/66.0]
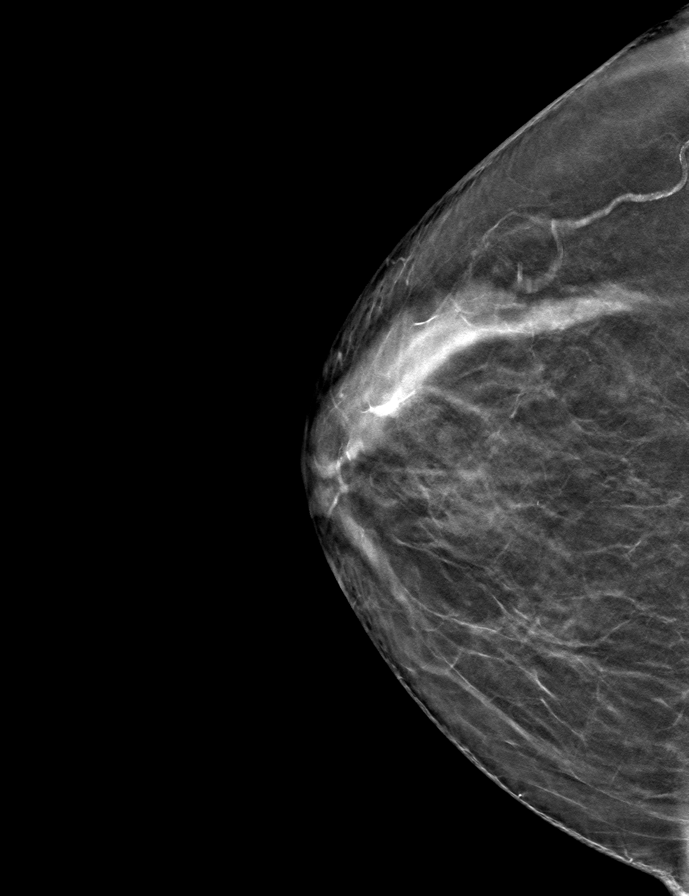

[9 of 24 positions shown; findings below may reference images not displayed]

ACR Breast Density Category b: There are scattered areas of
fibroglandular density.
FINDINGS: There are no findings suspicious for malignancy.
IMPRESSION: No mammographic evidence of malignancy. A result letter of this
screening mammogram will be mailed directly to the patient.

RECOMMENDATION:
Screening mammogram in one year. (Code:51-O-LD2)

BI-RADS CATEGORY  1: Negative.

## 2022-05-12 ENCOUNTER — Ambulatory Visit: Payer: Medicare Other | Admitting: Internal Medicine

## 2022-05-23 ENCOUNTER — Ambulatory Visit
Admission: RE | Admit: 2022-05-23 | Discharge: 2022-05-23 | Disposition: A | Discharge status: Home / Self Care | Payer: Medicare Other | Source: Ambulatory Visit | Attending: Internal Medicine | Admitting: Internal Medicine

## 2022-05-23 DIAGNOSIS — Z1231 Encounter for screening mammogram for malignant neoplasm of breast: Secondary | ICD-10-CM | POA: Diagnosis not present

## 2022-05-24 ENCOUNTER — Telehealth: Payer: Self-pay | Admitting: Internal Medicine

## 2022-05-24 NOTE — Telephone Encounter (Signed)
Phone call placed to patient this evening.  Advised that I received her mammogram report and I am not listed as her PCP.  Patient tells me that Jilda Panda, MD was her PCP but she request to be changed to [place].  She had received a letter informing her that she was due for mammogram again so she went ahead and scheduled it.  When they ask who is her PCP, she gave them my name even though she has not formally established with me as yet.  She has an appointment to establish care with [place] in December.  Informed her that her mammogram was normal and that she should be receiving a letter in the mail in regards to that.

## 2022-07-12 ENCOUNTER — Ambulatory Visit: Payer: Medicare Other | Admitting: Internal Medicine

## 2022-07-23 ENCOUNTER — Encounter (HOSPITAL_COMMUNITY): Payer: Self-pay | Admitting: *Deleted

## 2022-07-23 ENCOUNTER — Ambulatory Visit (INDEPENDENT_AMBULATORY_CARE_PROVIDER_SITE_OTHER): Payer: Medicare Other

## 2022-07-23 ENCOUNTER — Other Ambulatory Visit: Payer: Self-pay

## 2022-07-23 ENCOUNTER — Ambulatory Visit (HOSPITAL_COMMUNITY)
Admission: EM | Admit: 2022-07-23 | Discharge: 2022-07-23 | Disposition: A | Discharge status: Home / Self Care | Payer: Medicare Other | Attending: Physician Assistant | Admitting: Physician Assistant

## 2022-07-23 DIAGNOSIS — S8011XA Contusion of right lower leg, initial encounter: Secondary | ICD-10-CM | POA: Diagnosis not present

## 2022-07-23 DIAGNOSIS — M79661 Pain in right lower leg: Secondary | ICD-10-CM | POA: Diagnosis not present

## 2022-07-23 DIAGNOSIS — M79604 Pain in right leg: Secondary | ICD-10-CM

## 2022-07-23 DIAGNOSIS — Z043 Encounter for examination and observation following other accident: Secondary | ICD-10-CM | POA: Diagnosis not present

## 2022-07-23 DIAGNOSIS — W19XXXA Unspecified fall, initial encounter: Secondary | ICD-10-CM

## 2022-07-23 MED ORDER — IBUPROFEN 600 MG PO TABS: 600.0000 mg | ORAL_TABLET | Freq: Three times a day (TID) | ORAL | 0 refills | Status: DC | PRN

## 2022-07-23 NOTE — Discharge Instructions (Signed)
Your x-ray was normal.  I suspect that you have a deep bruise causing your symptoms.  Use a heating pad and keep your leg elevated.  Take ibuprofen for pain.  Do not take additional NSAIDs with this medication including aspirin, ibuprofen/Advil, naproxen/Aleve as it can cause stomach bleeding.  You can use Tylenol/acetaminophen for symptom relief.  If your symptoms are not improving quickly please follow-up with sports medicine; call to schedule an appointment.  If you have any worsening symptoms including inability to walk, increased pain, numbness or tingling sensation in your leg or foot you should be seen immediately.

## 2022-07-23 NOTE — ED Triage Notes (Signed)
Pt reports she fell in [employer] yesterday on a wet floor . Pt reports she needed help to get up after fall. During the night Pt reports she got up to go to BR and her RT leg gave out.Pt points to Rt lower ,lateral part of her RT leg. Pt tried BC powders with out help.

## 2022-07-23 NOTE — ED Provider Notes (Signed)
[facility]    CSN: [id number] Arrival date & time: 07/23/22  1005      History   Chief Complaint Chief Complaint  Patient presents with   Fall   Leg Pain    HPI Rebecca Glover is a 66 y.o. female.   Patient reports that yesterday she was at [facility] when she slipped on the wet floor causing her to fall.  She is not exactly sure how she fell/landed.  She is confident that she did not hit her head and denies any loss of consciousness.  She denies any headache, dizziness, nausea, vomiting, visual disturbance, amnesia surrounding event.  She does not take blood thinning medications on a regular basis.  Reports that she was initially having some soreness in her upper legs but this is mild.  Overnight when she woke up to use the bathroom at approximately 2 AM she found that her left leg was painful and almost gave out on her.  She reports that the pain is only present with certain movements and not all the time.  When it occurs it is rated 6 on a 0-10 pain scale, described as sharp, no aggravating or alleviating factors identified.  She has tried [facility] powders without improvement of symptoms.  Denies any numbness or paresthesias in the foot or leg.  She is able to bear weight.  Reports she is able to ascend/descend stairs without difficulty.    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      Home Medications    Prior to Admission medications   Medication Sig Start Date  End Date Taking? Authorizing Provider  acetaminophen (TYLENOL) 500 MG tablet Take 1 tablet (500 mg total) by mouth every 6 (six) hours. 03/20/21   Pearson Grippe, DO  Aspirin-Salicylamide-Caffeine (BC HEADACHE POWDER PO) Take 1 packet by mouth daily as needed (headaches).    [provider]  ibuprofen (ADVIL) 600 MG tablet Take 1 tablet (600 mg total) by mouth every 8 (eight) hours as needed for moderate pain. 07/23/22   Zaylon Bossier K, PA-C  lisinopril (PRINIVIL,ZESTRIL) 10 MG tablet Take 10 mg by mouth daily.    [provider]  metFORMIN (GLUCOPHAGE) 850 MG tablet Take 850 mg by mouth daily.      [provider]  pravastatin (PRAVACHOL) 40 MG tablet Take 40 mg by mouth daily.      [provider]    Family History Family History  Problem Relation Age of Onset   Diabetes Mother    Breast cancer Sister 79   Colon cancer Neg Hx     Social History Social History   Tobacco Use   Smoking status: Former   Smokeless tobacco: Never  Substance Use Topics   Alcohol use: No    Alcohol/week: 0.0 standard drinks of alcohol   Drug use: No     Allergies   Codeine  Review of Systems Review of Systems  Constitutional:  Positive for activity change. Negative for appetite change, fatigue and fever.  Eyes:  Negative for photophobia and visual disturbance.  Gastrointestinal:  Negative for abdominal pain, diarrhea, nausea and vomiting.  Musculoskeletal:  Positive for arthralgias and gait problem. Negative for joint swelling and myalgias.  Neurological:  Negative for dizziness, weakness, light-headedness, numbness and headaches.     Physical Exam Triage Vital Signs ED Triage Vitals  Enc Vitals Group     BP 07/23/22 1038 (!) 148/93     Pulse Rate 07/23/22 1038 (!) 107     Resp 07/23/22 1038 18     Temp 07/23/22 1038 98.5 F (36.9 C)     Temp src --      SpO2 07/23/22 1038 94 %     Weight --      Height --      Head Circumference --      Peak  Flow --      Pain Score 07/23/22 1036 5     Pain Loc --      Pain Edu? --      Excl. in [place]? --    No data found.  Updated Vital Signs BP (!) 148/93   Pulse (!) 107   Temp 98.5 F (36.9 C)   Resp 18   SpO2 94%   Visual Acuity Right Eye Distance:   Left Eye Distance:   Bilateral Distance:    Right Eye Near:   Left Eye Near:    Bilateral Near:     Physical Exam Vitals reviewed.  Constitutional:      General: She is awake. She is not in acute distress.    Appearance: Normal appearance. She is well-developed. She is not ill-appearing.     Comments: Very pleasant female appears stated age in no acute distress sitting comfortably in exam room  HENT:     Head: Normocephalic and atraumatic. No raccoon eyes or Battle's sign.     Right Ear: Tympanic membrane normal. No hemotympanum.     Left Ear: Tympanic membrane normal. No hemotympanum.     Nose: Nose normal.  Eyes:     Extraocular Movements: Extraocular movements intact.     Conjunctiva/sclera: Conjunctivae normal.     Pupils: Pupils are equal, round, and reactive to light.  Cardiovascular:     Rate and Rhythm: Normal rate and regular rhythm.     Heart sounds: Normal heart sounds, S1 normal and S2 normal. No murmur heard. Pulmonary:     Effort: Pulmonary effort is normal.     Breath sounds: Normal breath sounds. No wheezing, rhonchi or rales.     Comments: Clear to auscultation bilaterally Musculoskeletal:     Cervical back: No tenderness or bony tenderness. No spinous process tenderness or muscular tenderness.     Thoracic back: No tenderness or bony tenderness.     Lumbar back: No tenderness or bony tenderness.     Right lower leg: Tenderness and bony tenderness present. No deformity.     Comments: Right leg: Tenderness palpation over lateral lower leg.  No deformity noted.  Normal gait.  Foot neurovascularly intact.  Psychiatric:        Behavior: Behavior is cooperative.      UC Treatments / Results  Labs (all  labs ordered are listed, but only abnormal results are displayed) Labs Reviewed - No data to display  EKG   Radiology DG Tibia/Fibula Right  Result Date: 07/23/2022 CLINICAL DATA:  Fall EXAM: RIGHT TIBIA AND FIBULA - 2 VIEW COMPARISON:  None Available. FINDINGS: No fracture. No dislocation. No effusion. No discernible soft tissue abnormality. Ankle mortise is preserved. Normal alignment of the knee. IMPRESSION: No fracture or dislocation. Electronically Signed   By: Marin Roberts M.D.   On: 07/23/2022 11:26    Procedures Procedures (including critical care time)  Medications Ordered in UC Medications - No data to display  Initial Impression / Assessment and Plan / UC Course  I have reviewed the triage vital signs and the nursing notes.  Pertinent labs & imaging results that were available during my care of the patient were reviewed by me and considered in my medical decision making (see chart for details).     X-ray was obtained of lower leg with no acute osseous abnormality.  Suspect contusion as etiology of symptoms.  Patient was encouraged to use heating pad as well as elevation and rest for symptom relief.  She was started on ibuprofen 600 mg up to 3 times a day for pain with instruction not to take NSAIDs with this medication.  Can use Tylenol/acetaminophen for additional pain relief.  She is to rest and drink plenty of fluid.  Recommended that if her symptoms are proving quickly she follow-up with sports medicine and was given contact information for local provider with instruction to call to schedule an appointment.  If she has any worsening symptoms including increased pain, numbness, paresthesias, difficulty bearing weight she needs to be seen immediately.  Strict return precautions given.  Work excuse note provided.  Final Clinical Impressions(s) / UC Diagnoses   Final diagnoses:  Contusion of right lower extremity, initial encounter  Right leg pain  Fall, initial encounter      Discharge Instructions      Your x-ray was normal.  I suspect that you have a deep bruise causing your symptoms.  Use a heating pad and keep your leg elevated.  Take ibuprofen for pain.  Do not take additional NSAIDs with this medication including aspirin, ibuprofen/Advil, naproxen/Aleve as it can cause stomach bleeding.  You can use Tylenol/acetaminophen for symptom relief.  If your symptoms are not improving quickly please follow-up with sports medicine; call to schedule an appointment.  If you have any worsening symptoms including inability to walk, increased pain, numbness or tingling sensation in your leg or foot you should be seen immediately.     ED Prescriptions     Medication Sig Dispense Auth. Provider   ibuprofen (ADVIL) 600 MG tablet Take 1 tablet (600 mg total) by mouth every 8 (eight) hours as needed for moderate pain. 30 tablet Akif Weldy, Derry Skill, PA-C      PDMP not reviewed this encounter.   Terrilee Croak, PA-C 07/23/22 1150

## 2022-07-26 IMAGING — US US PELVIS COMPLETE WITH TRANSVAGINAL
1 series · 14 of 25 positions shown · non-contrast
Comparison: None

CLINICAL DATA: Lower abdominal cramping with small amounts of
vaginal bleeding

Hysterectomy performed 30 years ago
EXAM:
TRANSABDOMINAL AND TRANSVAGINAL ULTRASOUND OF PELVIS
TECHNIQUE: Both transabdominal and transvaginal ultrasound examinations of the
pelvis were performed. Transabdominal technique was performed for
global imaging of the pelvis including uterus, ovaries, adnexal
regions, and pelvic cul-de-sac. It was necessary to proceed with
endovaginal exam following the transabdominal exam to visualize the
adnexa.

[Series 1: us pelvis complete with transvaginal · 0.19mm/px · 14 of 40 slices shown]
[im 1/40]
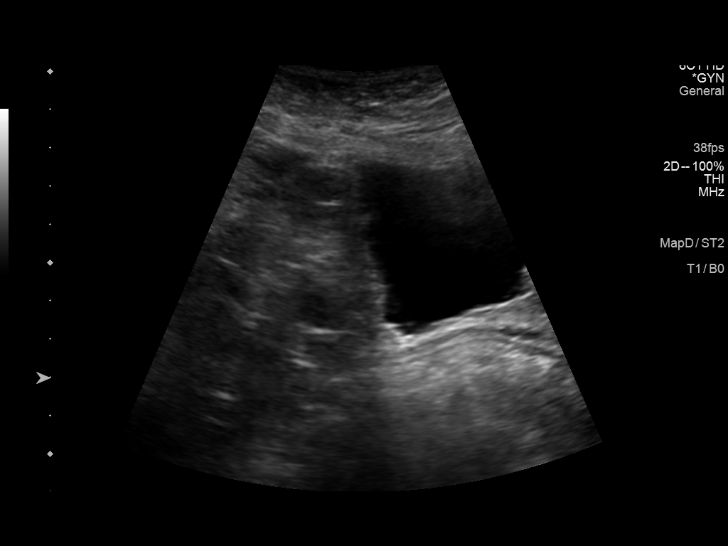
[im 4/40]
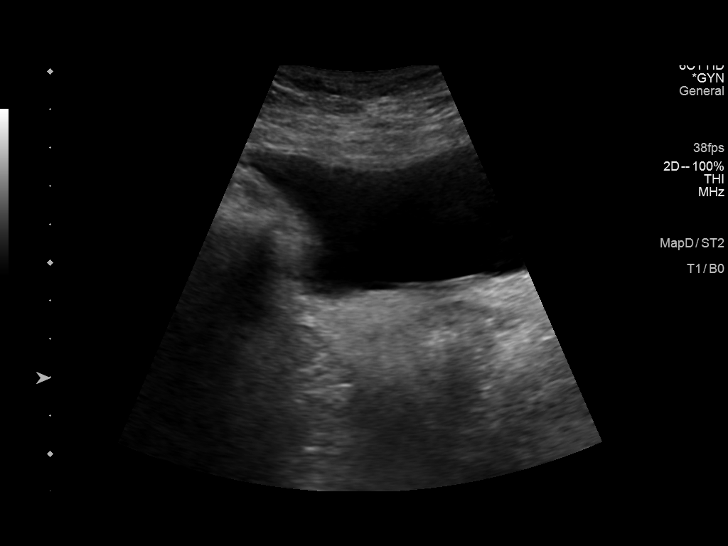
[im 7/40]
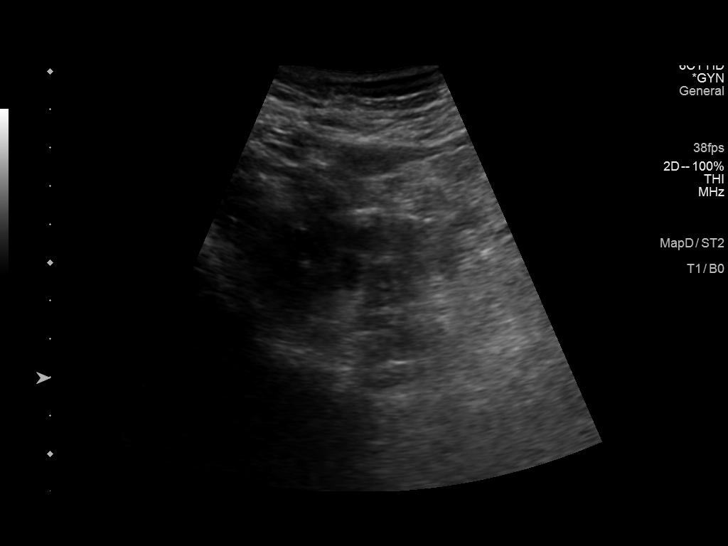
[im 10/40]
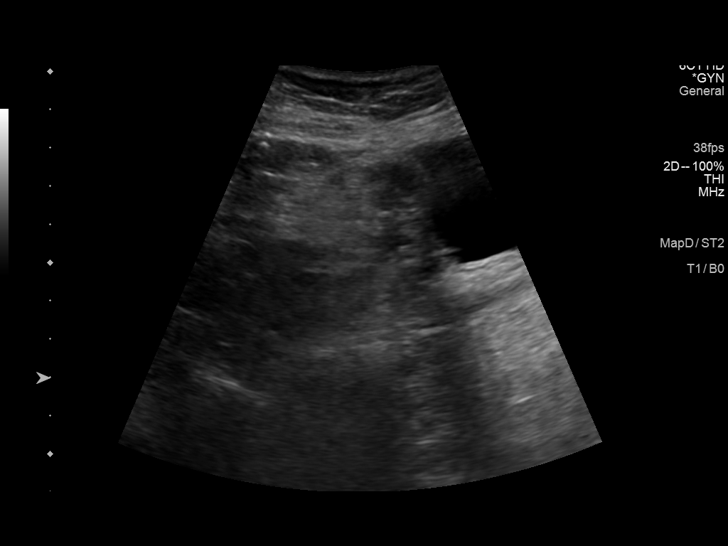
[im 14/40]
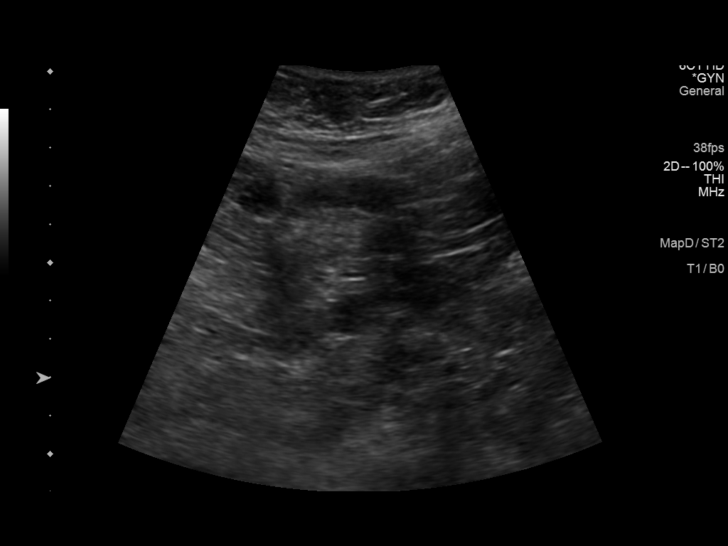
[im 15/40]
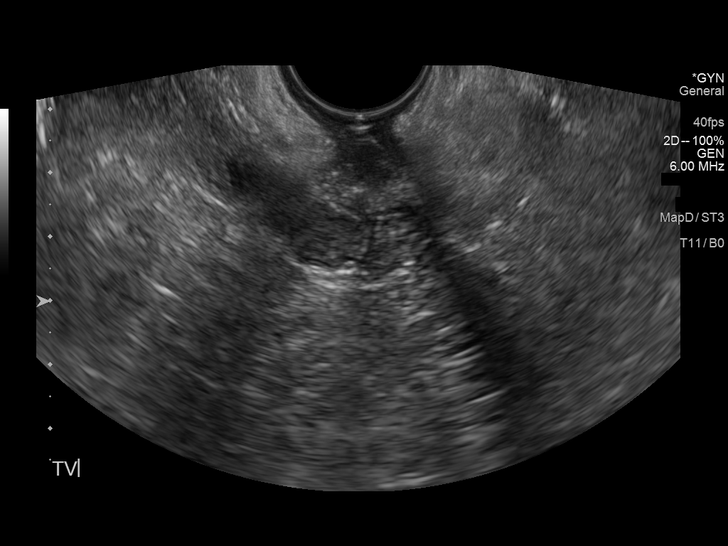
[im 18/40]
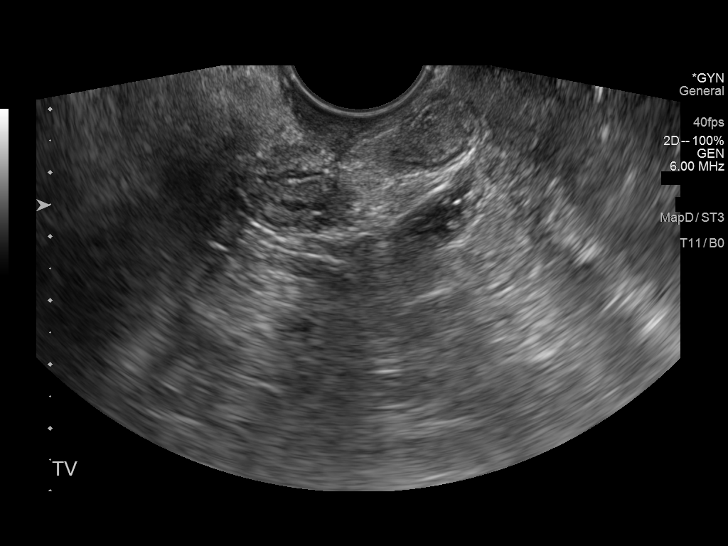
[im 22/40]
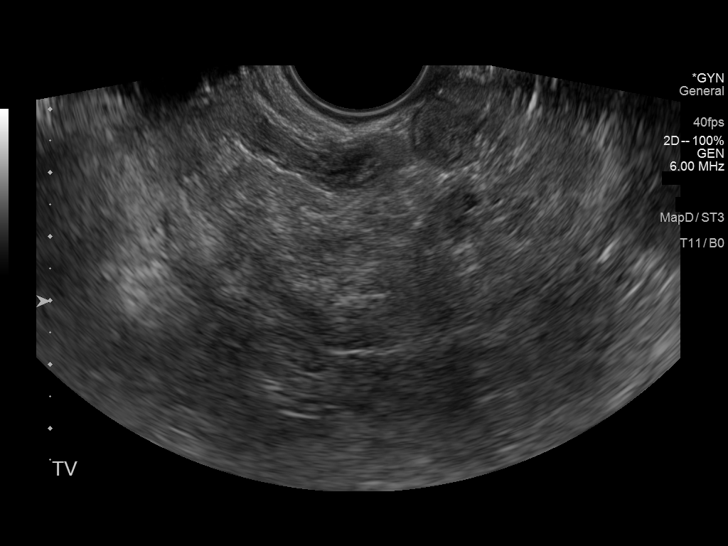
[im 25/40]
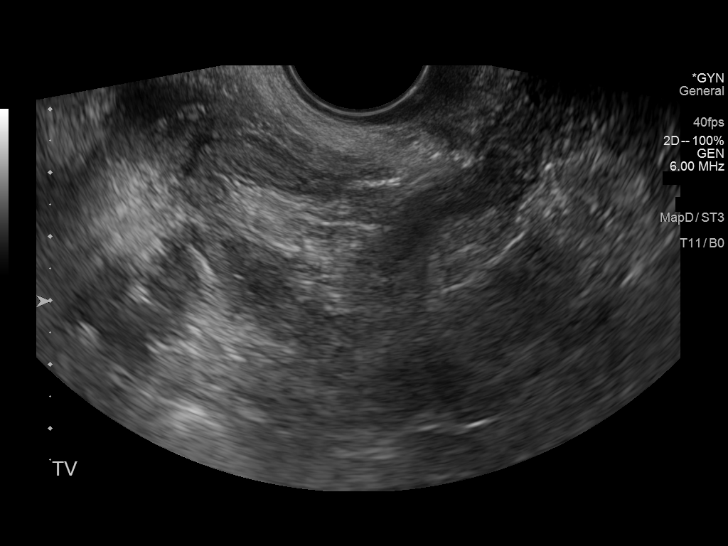
[im 27/40]
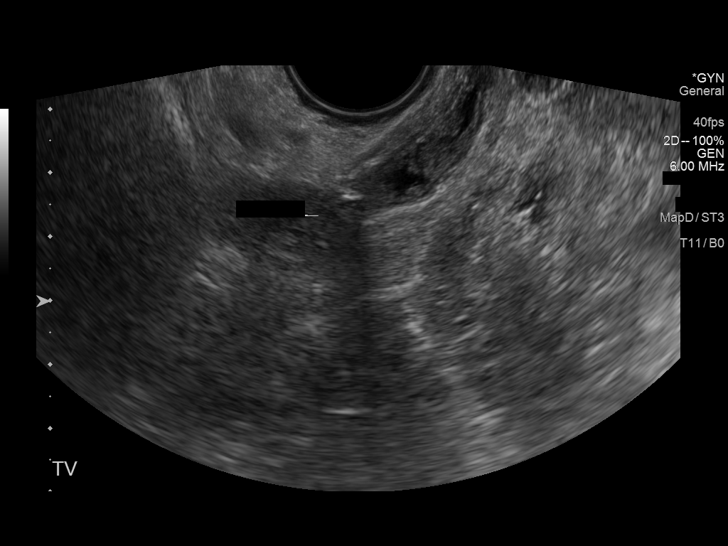
[im 30/40]
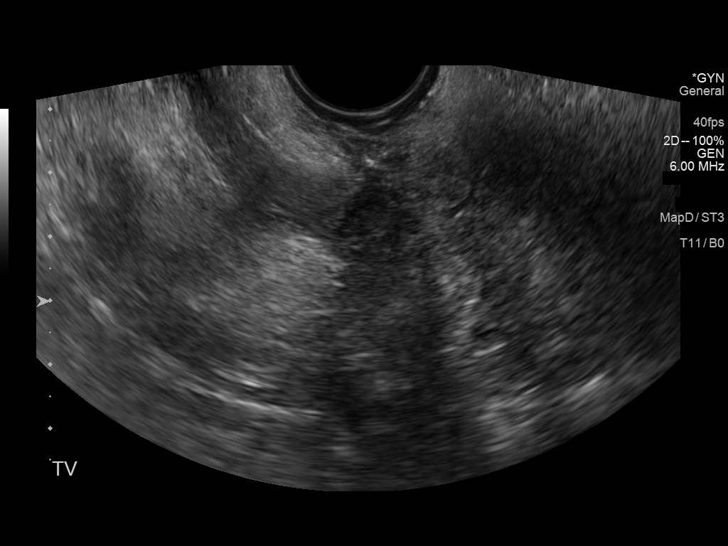
[im 33/40]
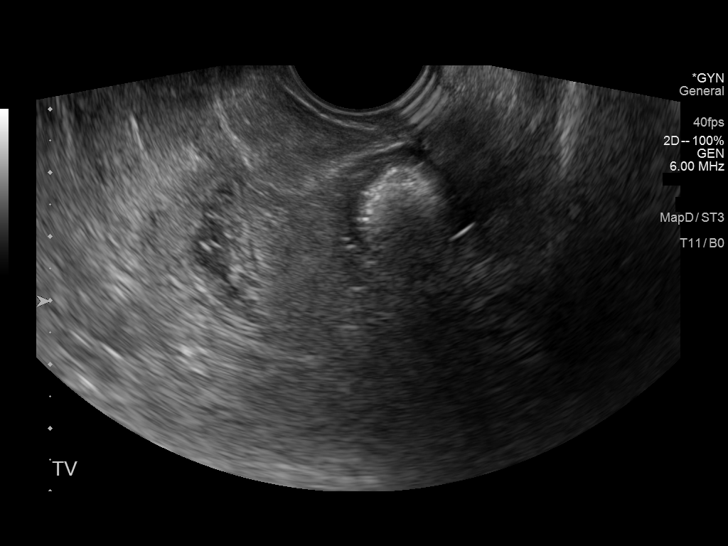
[im 36/40]
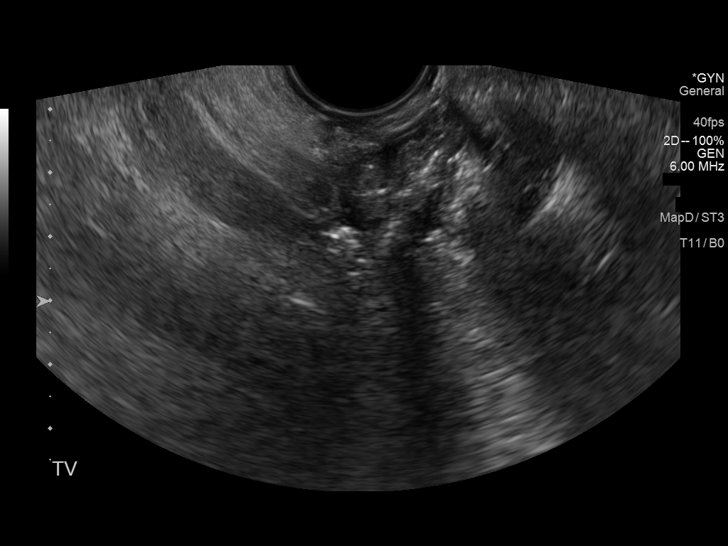
[im 40/40]
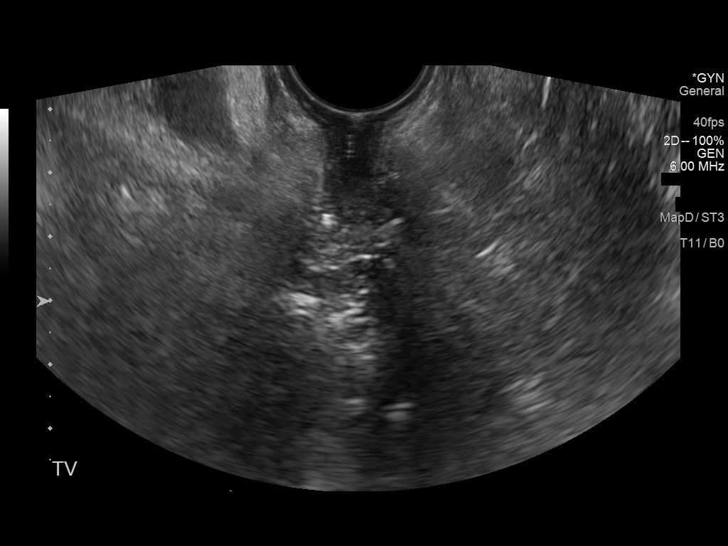

[14 of 25 positions shown; findings below may reference images not displayed]

FINDINGS: Uterus

Surgically absent.

Right ovary

Not visualized

Left ovary

Not visualized

Other findings

No abnormal free fluid.
IMPRESSION: No significant sonographic abnormality of the pelvis.

## 2022-08-15 DIAGNOSIS — H5203 Hypermetropia, bilateral: Secondary | ICD-10-CM | POA: Diagnosis not present

## 2022-08-15 DIAGNOSIS — E119 Type 2 diabetes mellitus without complications: Secondary | ICD-10-CM | POA: Diagnosis not present

## 2022-08-15 DIAGNOSIS — H2513 Age-related nuclear cataract, bilateral: Secondary | ICD-10-CM | POA: Diagnosis not present

## 2022-08-15 DIAGNOSIS — H524 Presbyopia: Secondary | ICD-10-CM | POA: Diagnosis not present

## 2022-08-15 DIAGNOSIS — H52223 Regular astigmatism, bilateral: Secondary | ICD-10-CM | POA: Diagnosis not present

## 2022-08-18 ENCOUNTER — Ambulatory Visit: Payer: Medicare Other | Attending: Internal Medicine | Admitting: Internal Medicine

## 2022-08-18 ENCOUNTER — Encounter: Payer: Self-pay | Admitting: Internal Medicine

## 2022-08-18 VITALS — BP 139/84 | HR 87 | Wt 152.6 lb

## 2022-08-18 DIAGNOSIS — R8290 Unspecified abnormal findings in urine: Secondary | ICD-10-CM | POA: Diagnosis not present

## 2022-08-18 DIAGNOSIS — E785 Hyperlipidemia, unspecified: Secondary | ICD-10-CM | POA: Diagnosis not present

## 2022-08-18 DIAGNOSIS — R519 Headache, unspecified: Secondary | ICD-10-CM

## 2022-08-18 DIAGNOSIS — J329 Chronic sinusitis, unspecified: Secondary | ICD-10-CM | POA: Diagnosis not present

## 2022-08-18 DIAGNOSIS — Z23 Encounter for immunization: Secondary | ICD-10-CM

## 2022-08-18 DIAGNOSIS — E1159 Type 2 diabetes mellitus with other circulatory complications: Secondary | ICD-10-CM | POA: Diagnosis not present

## 2022-08-18 DIAGNOSIS — Z1211 Encounter for screening for malignant neoplasm of colon: Secondary | ICD-10-CM

## 2022-08-18 DIAGNOSIS — I152 Hypertension secondary to endocrine disorders: Secondary | ICD-10-CM | POA: Diagnosis not present

## 2022-08-18 DIAGNOSIS — E1169 Type 2 diabetes mellitus with other specified complication: Secondary | ICD-10-CM

## 2022-08-18 DIAGNOSIS — Z7689 Persons encountering health services in other specified circumstances: Secondary | ICD-10-CM | POA: Diagnosis not present

## 2022-08-18 LAB — POCT URINALYSIS DIP (CLINITEK)
Bilirubin, UA: NEGATIVE
Blood, UA: NEGATIVE
Glucose, UA: NEGATIVE mg/dL
Ketones, POC UA: NEGATIVE mg/dL
Leukocytes, UA: NEGATIVE
Nitrite, UA: NEGATIVE
POC PROTEIN,UA: NEGATIVE
Spec Grav, UA: 1.01 (ref 1.010–1.025)
Urobilinogen, UA: 0.2 E.U./dL
pH, UA: 6.5 (ref 5.0–8.0)

## 2022-08-18 MED ORDER — LORATADINE 10 MG PO TABS: 10.0000 mg | ORAL_TABLET | Freq: Every day | ORAL | 3 refills | Status: DC

## 2022-08-18 MED ORDER — FLUTICASONE PROPIONATE 50 MCG/ACT NA SUSP: 2.0000 | Freq: Every day | NASAL | 1 refills | Status: DC | PRN

## 2022-08-18 MED ORDER — AMLODIPINE BESYLATE 10 MG PO TABS: 10.0000 mg | ORAL_TABLET | Freq: Every day | ORAL | 6 refills | Status: DC

## 2022-08-18 MED ORDER — METFORMIN HCL 850 MG PO TABS: 850.0000 mg | ORAL_TABLET | Freq: Two times a day (BID) | ORAL | 1 refills | Status: DC

## 2022-08-18 MED ORDER — LOSARTAN POTASSIUM-HCTZ 100-25 MG PO TABS: 1.0000 | ORAL_TABLET | Freq: Every day | ORAL | 6 refills | Status: DC

## 2022-08-18 MED ORDER — PRAVASTATIN SODIUM 40 MG PO TABS: 40.0000 mg | ORAL_TABLET | Freq: Every day | ORAL | 6 refills | Status: DC

## 2022-08-18 NOTE — Progress Notes (Signed)
Patient ID: Rebecca Glover, female    DOB: 04-24-1956  MRN: [id number]  CC: New Patient (Initial Visit)   Subjective: Rebecca Glover is a 66 y.o. female who presents for new pt visit Her concerns today include:   Previous PCP was Dr. Darnelle Maffucci x 8-9 yrs She decided to change because she felt he was not addressing her concerns, GERD, former smoker.  Pt with hx of DM, HTN, HL  Constant nasal congestion for yrs Seen by ENT for same 4 yrs ago.   surgery was recommended for deviated septum.  Her PCP told her that it is more likely allergies Uses Netti Pot Has chronic daily headaches.  Takes Mucinex and Suphedrine when [facility] Powder does not help headache Takes BC 2-3 x a day for yrs for HA.  Headache frontal over ethmoid sinuses.  Some dry eyes, no dizziness.   Wakes up with HA and nasal congestion. No redness or tearing, some pain behind the eyes at times.  Endorses nausea sometimes.  HA relieved with BC for hrs.    HTN: History of hypertension.  Currently on Norvasc 5 mg and Cozaar/HCTZ 100/25 mg daily. Not limiting salt in the food as much as she should.  No device to check blood pressure.  HL:  on Pravachol 40 mg daily.  Reports compliance with the medication and tolerating the medicine.  DM A1C 6.1 Not checking BS -eats 2 meals a day.  Trying to get off sodas.  Drinks fruit juices.  Currently on metformin 850 mg twice a day. Last eye exam was on Monday with [facility] Dr. Clifton James Complains of foul smelling urine for the past several days.  HM:  due for c-scope.  Polyps removed 2017.  Due for flu shot.  Last COVID booster 04/2022 [facility].  Never had PCV vaccine.  Agreeable to receiving that today. Patient Active Problem List   Diagnosis Date Noted   HYPERTENSION, BENIGN SYSTEMIC 11/30/2006   IRRITABLE BOWEL SYNDROME 11/30/2006     Current Outpatient Medications on File Prior to Visit  Medication Sig Dispense Refill   acetaminophen (TYLENOL) 500 MG tablet Take 1 tablet  (500 mg total) by mouth every 6 (six) hours. 30 tablet 0   Aspirin-Salicylamide-Caffeine (BC HEADACHE POWDER PO) Take 1 packet by mouth daily as needed (headaches).     ibuprofen (ADVIL) 600 MG tablet Take 1 tablet (600 mg total) by mouth every 8 (eight) hours as needed for moderate pain. 30 tablet 0   No current facility-administered medications on file prior to visit.    Allergies  Allergen Reactions   Codeine Nausea Only    Social History   Socioeconomic History   Marital status: Single    Spouse name: Not on file   Number of children: Not on file   Years of education: Not on file   Highest education level: Not on file  Occupational History   Not on file  Tobacco Use   Smoking status: Former    Packs/day: 0.25    Years: 5.00    Total pack years: 1.25    Types: Cigarettes   Smokeless tobacco: Never  Substance and Sexual Activity   Alcohol use: No   Drug use: No   Sexual activity: Not Currently    Birth control/protection: None  Other Topics Concern   Not on file  Social History Narrative   Not on file   Social Determinants of Health   Financial Resource Strain: Not on file  Food Insecurity: Not on file  Transportation Needs: Not on file  Physical Activity: Not on file  Stress: Not on file  Social Connections: Not on file  Intimate Partner Violence: Not on file    Family History  Problem Relation Age of Onset   Diabetes Mother    Breast cancer Sister 29   Stroke Sister    Arthritis Maternal Aunt    Colon cancer Neg Hx     Past Surgical History:  Procedure Laterality Date   ABDOMINAL HYSTERECTOMY  10/03/1980   APPENDECTOMY  10/03/1962   UPPER GASTROINTESTINAL ENDOSCOPY  10/04/2003   UPPER GASTROINTESTINAL ENDOSCOPY  10/03/1997   upper gi series    ROS: Review of Systems Negative except as stated above  PHYSICAL EXAM: BP 139/84   Pulse 87   Wt 152 lb 9.6 oz (69.2 kg)   SpO2 98%   BMI 27.91 kg/m   Physical Exam Repeat blood pressure  145/90 General appearance - alert, well appearing, older African-American female and in no distress Mental status - normal mood, behavior, speech, dress, motor activity, and thought processes Eyes - pupils equal and reactive, extraocular eye movements intact Nose -nasal mucosa dry and red.  Narrow nasal passage with enlargement of nasal turbinates left greater than right Mouth - mucous membranes moist, pharynx normal without lesions Neck - supple, no significant adenopathy Chest - clear to auscultation, no wheezes, rales or rhonchi, symmetric air entry Heart - normal rate, regular rhythm, normal S1, S2, no murmurs, rubs, clicks or gallops Extremities - peripheral pulses normal, no pedal edema, no clubbing or cyanosis Neuro: Cranial nerves grossly intact.  Power 5/5 throughout.  Gait appears normal.      No data to display         Results for orders placed or performed in visit on 08/18/22  POCT URINALYSIS DIP (CLINITEK)  Result Value Ref Range   Color, UA light yellow (A) yellow   Clarity, UA clear clear   Glucose, UA negative negative mg/dL   Bilirubin, UA negative negative   Ketones, POC UA negative negative mg/dL   Spec Grav, UA 1.010 1.010 - 1.025   Blood, UA negative negative   pH, UA 6.5 5.0 - 8.0   POC PROTEIN,UA negative negative, trace   Urobilinogen, UA 0.2 0.2 or 1.0 E.U./dL   Nitrite, UA Negative Negative   Leukocytes, UA Negative Negative     ASSESSMENT AND PLAN:  1. Encounter to establish care Patient to sign release for [place] to get her records from previous PCP.  2. Type 2 diabetes mellitus with hyperlipidemia ([place]) At goal. Continue metformin at current dose. Discussed on encourage healthy eating habits.  Advised to eliminate sugary drinks from the diet. - POCT glycosylated hemoglobin (Hb A1C) - CBC; Future - Comprehensive metabolic panel; Future - Lipid panel; Future - Microalbumin / creatinine urine ratio; Future - metFORMIN (GLUCOPHAGE) 850 MG  tablet; Take 1 tablet (850 mg total) by mouth 2 (two) times daily with a meal.  Dispense: 180 tablet; Refill: 1 - pravastatin (PRAVACHOL) 40 MG tablet; Take 1 tablet (40 mg total) by mouth daily.  Dispense: 30 tablet; Refill: 6  3. Hypertension associated with diabetes ([place]) Not at goal.  Increase amlodipine to 10 mg daily. - amLODipine (NORVASC) 10 MG tablet; Take 1 tablet (10 mg total) by mouth daily.  Dispense: 30 tablet; Refill: 6 - losartan-hydrochlorothiazide (HYZAAR) 100-25 MG tablet; Take 1 tablet by mouth daily.  Dispense: 30 tablet; Refill: 6  4. Chronic daily  headache Most likely medication overuse/rebound headaches.  Advised to try weaning herself off BC powder.  We will get her in with the neurologist. - Ambulatory referral to Neurology  5. Chronic congestion of paranasal sinus We will refer to ENT.  In the meantime I advised that she stops pseudoephedrine and Vicks nasal spray.  Start Claritin and Flonase nasal spray instead. - Ambulatory referral to ENT - loratadine (CLARITIN) 10 MG tablet; Take 1 tablet (10 mg total) by mouth daily.  Dispense: 30 tablet; Refill: 3 - fluticasone (FLONASE) 50 MCG/ACT nasal spray; Place 2 sprays into both nostrils daily as needed for allergies or rhinitis.  Dispense: 16 g; Refill: 1  6. Foul smelling urine Negative for UTI - POCT URINALYSIS DIP (CLINITEK)  7. Screening for colon cancer - Ambulatory referral to Gastroenterology  8. Need for influenza vaccination - Flu Vaccine QUAD High Dose(Fluad)  9. Need for vaccination against Streptococcus pneumoniae - Pneumococcal conjugate vaccine 20-valent    Patient was given the opportunity to ask questions.  Patient verbalized understanding of the plan and was able to repeat key elements of the plan.   This documentation was completed using Radio producer.  Any transcriptional errors are unintentional.  Orders Placed This Encounter  Procedures   Flu Vaccine QUAD High  Dose(Fluad)   Pneumococcal conjugate vaccine 20-valent   CBC   Comprehensive metabolic panel   Lipid panel   Microalbumin / creatinine urine ratio   Ambulatory referral to Gastroenterology   Ambulatory referral to Neurology   Ambulatory referral to ENT   POCT URINALYSIS DIP (CLINITEK)   POCT glycosylated hemoglobin (Hb A1C)     Requested Prescriptions   Signed Prescriptions Disp Refills   amLODipine (NORVASC) 10 MG tablet 30 tablet 6    Sig: Take 1 tablet (10 mg total) by mouth daily.   losartan-hydrochlorothiazide (HYZAAR) 100-25 MG tablet 30 tablet 6    Sig: Take 1 tablet by mouth daily.   metFORMIN (GLUCOPHAGE) 850 MG tablet 180 tablet 1    Sig: Take 1 tablet (850 mg total) by mouth 2 (two) times daily with a meal.   pravastatin (PRAVACHOL) 40 MG tablet 30 tablet 6    Sig: Take 1 tablet (40 mg total) by mouth daily.   loratadine (CLARITIN) 10 MG tablet 30 tablet 3    Sig: Take 1 tablet (10 mg total) by mouth daily.   fluticasone (FLONASE) 50 MCG/ACT nasal spray 16 g 1    Sig: Place 2 sprays into both nostrils daily as needed for allergies or rhinitis.    Return in about 3 months (around 11/18/2022) for Give appt with CMA for Medicare Wellness.  Karle Plumber, MD, FACP

## 2022-08-18 NOTE — Patient Instructions (Signed)
Increase amlodipine to 10 mg daily. Stop pseudoephedrine. Take Claritin 10 mg daily.  Use Flonase nasal spray 2-3 times a week as needed for sinus congestion.  I have referred you to an ear nose and throat specialist.  Try to wean yourself off BC powder.  I have referred you to a neurologist.

## 2022-08-18 NOTE — Progress Notes (Signed)
Stuffy Nose Foul smelling urine. A1C- 6.1

## 2022-08-19 ENCOUNTER — Other Ambulatory Visit: Payer: Medicare Other

## 2022-08-19 LAB — POCT GLYCOSYLATED HEMOGLOBIN (HGB A1C): HbA1c, POC (controlled diabetic range): 6.1 % (ref 0.0–7.0)

## 2022-08-23 ENCOUNTER — Ambulatory Visit: Payer: Medicare Other | Attending: Internal Medicine

## 2022-08-23 DIAGNOSIS — E1169 Type 2 diabetes mellitus with other specified complication: Secondary | ICD-10-CM

## 2022-08-23 DIAGNOSIS — E785 Hyperlipidemia, unspecified: Secondary | ICD-10-CM | POA: Diagnosis not present

## 2022-08-24 LAB — CBC
Hematocrit: 45.3 % (ref 34.0–46.6)
Hemoglobin: 15.1 g/dL (ref 11.1–15.9)
MCH: 27.8 pg (ref 26.6–33.0)
MCHC: 33.3 g/dL (ref 31.5–35.7)
MCV: 83 fL (ref 79–97)
Platelets: 354 10*3/uL (ref 150–450)
RBC: 5.44 x10E6/uL — ABNORMAL HIGH (ref 3.77–5.28)
RDW: 13.3 % (ref 11.7–15.4)
WBC: 8.3 10*3/uL (ref 3.4–10.8)

## 2022-08-24 LAB — COMPREHENSIVE METABOLIC PANEL
ALT: 25 IU/L (ref 0–32)
AST: 34 IU/L (ref 0–40)
Albumin/Globulin Ratio: 1.8 (ref 1.2–2.2)
Albumin: 4.9 g/dL (ref 3.9–4.9)
Alkaline Phosphatase: 114 IU/L (ref 44–121)
BUN/Creatinine Ratio: 18 (ref 12–28)
BUN: 14 mg/dL (ref 8–27)
Bilirubin Total: 0.3 mg/dL (ref 0.0–1.2)
CO2: 21 mmol/L (ref 20–29)
Calcium: 9.9 mg/dL (ref 8.7–10.3)
Chloride: 99 mmol/L (ref 96–106)
Creatinine, Ser: 0.76 mg/dL (ref 0.57–1.00)
Globulin, Total: 2.7 g/dL (ref 1.5–4.5)
Glucose: 102 mg/dL — ABNORMAL HIGH (ref 70–99)
Potassium: 3.8 mmol/L (ref 3.5–5.2)
Sodium: 141 mmol/L (ref 134–144)
Total Protein: 7.6 g/dL (ref 6.0–8.5)
eGFR: 86 mL/min/{1.73_m2} (ref >59)

## 2022-08-24 LAB — LIPID PANEL
Chol/HDL Ratio: 2.8 ratio (ref 0.0–4.4)
Cholesterol, Total: 145 mg/dL (ref 100–199)
HDL: 51 mg/dL (ref >39)
LDL Chol Calc (NIH): 73 mg/dL (ref 0–99)
Triglycerides: 115 mg/dL (ref 0–149)
VLDL Cholesterol Cal: 21 mg/dL (ref 5–40)

## 2022-08-24 LAB — MICROALBUMIN / CREATININE URINE RATIO
Creatinine, Urine: 127.1 mg/dL
Microalb/Creat Ratio: 3 mg/g creat (ref 0–29)
Microalbumin, Urine: 4.2 ug/mL

## 2022-09-02 ENCOUNTER — Ambulatory Visit: Payer: Medicare Other | Admitting: Internal Medicine

## 2022-09-15 NOTE — Progress Notes (Signed)
Referring:  Ladell Pier, MD [place],  [place] [id number]  PCP: Ladell Pier, MD  Neurology was asked to evaluate Rebecca Glover, a 66 year old female for a chief complaint of headaches.  Our recommendations of care will be communicated by shared medical record.    CC:  headaches  History provided from self  HPI:  Medical co-morbidities: HTN, DM2, HLD  The patient presents for evaluation of daily headaches which began several years ago. Initially saw ENT who thought these were related to her deviated septum. She was recommended treatment for the deviated septum but her PCP told her headaches were likely allergy related, so she never had anything done.  Headaches occur every morning when she wakes up. They are typically frontal and are associated with photophobia, phonophobia, and nausea. She takes BC powder every day up to 3 times a day for her headaches. This relieves her headache temporarily but the pain always returns.  Headache History: Onset: several years ago Triggers: wakes up with them Aura: none Location: frontal, sometimes occipital Associated Symptoms:  Photophobia: yes  Phonophobia: yes  Nausea: has nausea at baseline due to constipation Worse with activity?: yes Duration of headaches: 30 minutes-1 hour with medication  Headache days per month: 30 Headache free days per month: 0  Current Treatment: Abortive BC powder  Preventative none  Prior Therapies                                 Tylenol BC Powder Losartan  Lisinopril 10 mg daily  LABS: CBC    Component Value Date/Time   WBC 8.3 08/23/2022 0852   RBC 5.44 (H) 08/23/2022 0852   HGB 15.1 08/23/2022 0852   HCT 45.3 08/23/2022 0852   PLT 354 08/23/2022 0852   MCV 83 08/23/2022 0852   MCH 27.8 08/23/2022 0852   MCHC 33.3 08/23/2022 0852   RDW 13.3 08/23/2022 0852      Latest Ref Rng & Units 08/23/2022    8:52 AM  CMP  Glucose 70 - 99 mg/dL 102   BUN 8 - 27  mg/dL 14   Creatinine 0.57 - 1.00 mg/dL 0.76   Sodium 134 - 144 mmol/L 141   Potassium 3.5 - 5.2 mmol/L 3.8   Chloride 96 - 106 mmol/L 99   CO2 20 - 29 mmol/L 21   Calcium 8.7 - 10.3 mg/dL 9.9   Total Protein 6.0 - 8.5 g/dL 7.6   Total Bilirubin 0.0 - 1.2 mg/dL 0.3   Alkaline Phos 44 - 121 IU/L 114   AST 0 - 40 IU/L 34   ALT 0 - 32 IU/L 25      IMAGING:  none   Current Outpatient Medications on File Prior to Visit  Medication Sig Dispense Refill   amLODipine (NORVASC) 10 MG tablet Take 1 tablet (10 mg total) by mouth daily. 30 tablet 6   Aspirin-Salicylamide-Caffeine (BC HEADACHE POWDER PO) Take 1 packet by mouth daily as needed (headaches).     bisacodyl (DULCOLAX) 10 MG suppository Place 10 mg rectally as needed for moderate constipation.     bismuth subsalicylate (PEPTO BISMOL) 262 MG chewable tablet Chew 524 mg by mouth as needed.     calcium carbonate (TUMS - DOSED IN MG ELEMENTAL CALCIUM) 500 MG chewable tablet Chew 1 tablet by mouth daily.     Fexofenadine HCl (MUCINEX ALLERGY PO) Take by mouth.  fluticasone (FLONASE) 50 MCG/ACT nasal spray Place 2 sprays into both nostrils daily as needed for allergies or rhinitis. 16 g 1   loratadine (CLARITIN) 10 MG tablet Take 1 tablet (10 mg total) by mouth daily. 30 tablet 3   losartan-hydrochlorothiazide (HYZAAR) 100-25 MG tablet Take 1 tablet by mouth daily. 30 tablet 6   metFORMIN (GLUCOPHAGE) 850 MG tablet Take 1 tablet (850 mg total) by mouth 2 (two) times daily with a meal. 180 tablet 1   oxymetazoline (AFRIN) 0.05 % nasal spray Place 1 spray into both nostrils 2 (two) times daily.     pravastatin (PRAVACHOL) 40 MG tablet Take 1 tablet (40 mg total) by mouth daily. 30 tablet 6   acetaminophen (TYLENOL) 500 MG tablet Take 1 tablet (500 mg total) by mouth every 6 (six) hours. 30 tablet 0   ibuprofen (ADVIL) 600 MG tablet Take 1 tablet (600 mg total) by mouth every 8 (eight) hours as needed for moderate pain. 30 tablet 0   No  current facility-administered medications on file prior to visit.     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 Positive for headaches. All other systems reviewed and negative unless stated otherwise in HPI.   Physical Exam:   Vital Signs: BP (!) 140/94   Pulse (!) 114   Ht 5' (1.524 m)   Wt 154 lb 6.4 oz (70 kg)   BMI 30.15 kg/m  GENERAL: well appearing,in no acute distress,alert SKIN:  Color, texture, turgor normal. No rashes or lesions HEAD:  Normocephalic/atraumatic. CV:  RRR RESP: Normal respiratory effort MSK: no tenderness to palpation over occiput, neck, or shoulders  NEUROLOGICAL: Mental Status: Alert, oriented to person, place and time,Follows commands Cranial Nerves: PERRL, visual fields intact to confrontation, extraocular movements intact, facial sensation intact, no facial droop or ptosis, hearing grossly intact, no dysarthria Motor: muscle strength 5/5 both upper and  lower extremities,no drift, normal tone Reflexes: 2+ throughout Sensation: intact to light touch all 4 extremities Coordination: Finger-to- nose-finger intact bilaterally Gait: normal-based   IMPRESSION: 66 year old female with a history of HTN, DM2, HLD who presents for evaluation of worsening headaches. Will order [facility] for daily headaches in patient >50. Discussed MRI, but she does not believe she would be able to tolerate this even with sedating medication. Discussed weaning BC powder as she likely has a component  of medication overuse headache. Will start Topamax for headache prevention. Maxalt started for rescue of her more severe headaches which have migrainous features.  PLAN: -CT head -Prevention: Start Topamax 25 mg QHS, uptitrate by 25 mg weekly up to 100 mg QHS -Rescue: Start Maxalt 10 mg PRN   I spent a total of 25 minutes chart reviewing and counseling the patient. Headache education was done. Discussed treatment options including preventive and acute medications. Discussed medication overuse headache and to limit use of acute treatments to no more than 2 days/week or 10 days/month. Discussed medication side effects, adverse reactions and drug interactions. Written educational materials and patient instructions outlining all of the above were given.  Follow-up: 6 months  Genia Harold, MD 09/19/2022   8:47 AM

## 2022-09-19 ENCOUNTER — Encounter: Payer: Self-pay | Admitting: Psychiatry

## 2022-09-19 ENCOUNTER — Ambulatory Visit: Payer: Medicare Other | Admitting: Psychiatry

## 2022-09-19 VITALS — BP 140/94 | HR 114 | Ht 60.0 in | Wt 154.4 lb

## 2022-09-19 DIAGNOSIS — R519 Headache, unspecified: Secondary | ICD-10-CM | POA: Diagnosis not present

## 2022-09-19 DIAGNOSIS — G43709 Chronic migraine without aura, not intractable, without status migrainosus: Secondary | ICD-10-CM

## 2022-09-19 MED ORDER — RIZATRIPTAN BENZOATE 10 MG PO TABS: 10.0000 mg | ORAL_TABLET | ORAL | 6 refills | Status: DC | PRN

## 2022-09-19 MED ORDER — TOPIRAMATE 25 MG PO TABS: ORAL_TABLET | ORAL | 6 refills | Status: DC

## 2022-09-19 NOTE — Patient Instructions (Addendum)
Plan: -CT scan of the brain -Start Topamax for headache prevention. Take 25 mg (1 pill) at bedtime for one week, then increase to 50 mg (2 pills) at bedtime for one week, then take 75 mg (3 pills) at bedtime for one week, then take 100 mg (4 pills) at bedtime -Start rizatriptan as needed for migraines. Take at the onset of migraine. If headache recurs or does not fully resolve, you may take a second dose after 2 hours. Side effects may include drowsiness. Please avoid taking more than 2 days per week to avoid rebound headaches -Please try to decrease BC powder use to 2 days per week to avoid rebound headaches

## 2022-09-21 ENCOUNTER — Telehealth: Payer: Self-pay | Admitting: Psychiatry

## 2022-09-21 NOTE — Telephone Encounter (Signed)
[employer] medicare NPR sent to GI [phone] 

## 2022-10-08 ENCOUNTER — Other Ambulatory Visit: Payer: Self-pay | Admitting: Internal Medicine

## 2022-10-08 DIAGNOSIS — J329 Chronic sinusitis, unspecified: Secondary | ICD-10-CM

## 2022-10-10 NOTE — Telephone Encounter (Signed)
Requested Prescriptions  Pending Prescriptions Disp Refills   loratadine (CLARITIN) 10 MG tablet [Pharmacy Med Name: LORATADINE 10 MG TABLET] 90 tablet 1    Sig: TAKE 1 TABLET BY MOUTH EVERY DAY     Ear, Nose, and Throat:  Antihistamines 2 Passed - 10/08/2022  2:22 PM      Passed - Cr in normal range and within 360 days    Creatinine, Ser  Date Value Ref Range Status  08/23/2022 0.76 0.57 - 1.00 mg/dL Final         Passed - Valid encounter within last 12 months    Recent Outpatient Visits           1 month ago Encounter to establish care   [place], MD       Future Appointments             In 1 month Wynetta Emery Dalbert Batman, MD [place]

## 2022-10-24 ENCOUNTER — Ambulatory Visit
Admission: RE | Admit: 2022-10-24 | Discharge: 2022-10-24 | Disposition: A | Discharge status: Home / Self Care | Payer: Medicare Other | Source: Ambulatory Visit | Attending: Psychiatry | Admitting: Psychiatry

## 2022-10-24 DIAGNOSIS — R519 Headache, unspecified: Secondary | ICD-10-CM | POA: Diagnosis not present

## 2022-11-17 ENCOUNTER — Ambulatory Visit: Payer: Medicare Other

## 2022-11-21 ENCOUNTER — Encounter: Payer: Self-pay | Admitting: Internal Medicine

## 2022-11-21 ENCOUNTER — Ambulatory Visit: Payer: Medicare Other | Attending: Internal Medicine | Admitting: Internal Medicine

## 2022-11-21 ENCOUNTER — Other Ambulatory Visit: Payer: Self-pay

## 2022-11-21 VITALS — BP 138/88 | HR 91 | Ht 62.0 in | Wt 154.6 lb

## 2022-11-21 DIAGNOSIS — E1159 Type 2 diabetes mellitus with other circulatory complications: Secondary | ICD-10-CM

## 2022-11-21 DIAGNOSIS — F17210 Nicotine dependence, cigarettes, uncomplicated: Secondary | ICD-10-CM | POA: Diagnosis not present

## 2022-11-21 DIAGNOSIS — R519 Headache, unspecified: Secondary | ICD-10-CM

## 2022-11-21 DIAGNOSIS — I152 Hypertension secondary to endocrine disorders: Secondary | ICD-10-CM | POA: Diagnosis not present

## 2022-11-21 DIAGNOSIS — E785 Hyperlipidemia, unspecified: Secondary | ICD-10-CM | POA: Diagnosis not present

## 2022-11-21 DIAGNOSIS — E1169 Type 2 diabetes mellitus with other specified complication: Secondary | ICD-10-CM

## 2022-11-21 DIAGNOSIS — Z23 Encounter for immunization: Secondary | ICD-10-CM | POA: Diagnosis not present

## 2022-11-21 DIAGNOSIS — Z6828 Body mass index (BMI) 28.0-28.9, adult: Secondary | ICD-10-CM

## 2022-11-21 DIAGNOSIS — E663 Overweight: Secondary | ICD-10-CM

## 2022-11-21 LAB — GLUCOSE, POCT (MANUAL RESULT ENTRY): POC Glucose: 97 mg/dl (ref 70–99)

## 2022-11-21 MED ORDER — PROPRANOLOL HCL 10 MG PO TABS: 10.0000 mg | ORAL_TABLET | Freq: Two times a day (BID) | ORAL | 6 refills | Status: DC

## 2022-11-21 MED ORDER — ZOSTER VAC RECOMB ADJUVANTED 50 MCG/0.5ML IM SUSR
0.5000 mL | Freq: Once | INTRAMUSCULAR | 0 refills | Status: AC
  Filled 2022-11-21: qty 1, 1d supply, fill #0

## 2022-11-21 NOTE — Patient Instructions (Signed)
Your blood pressure is not at goal.  Goal is 130/80 or lower.  We have added a new medication called propranolol 10 mg twice a day.  This will also help in controlling the migraine headaches.

## 2022-11-21 NOTE — Progress Notes (Signed)
Patient ID: Rebecca Glover, female    DOB: 1956/03/13  MRN: [id number]  CC: chronic ds management  Subjective: Rebecca Glover is a 66 y.o. female who presents for chronic ds management. Her concerns today include:  Pt with hx of DM, HTN, HL   DM:   Lab Results  Component Value Date   HGBA1C 6.1 08/19/2022  BS 97 Compliant with metformin 850 mg twice a day.  Tolerating med okay Wgh is stable.  Emotional eater at times.   Moving more than she did in the past.  Plans to get into gym.    HTN: Reports compliance with taking Norvasc 10 mg daily and Cozaar/HCTZ 100/25 mg daily. Took already for today.  Limits salt somewhat Not checking BP often.   Referred to Dr. Billey Gosling on last visit for chronic daily HA.  Told to stop BC and was started on Maxalt and Topamax.  Reports she has not had to use the Maxalt but has used the [facility] several times since then for HA.  Afraid to use the topamax with titration.  CT of the head showed 5 mm calcification questionable meningioma.  HL: Taking and tolerating Pravachol.  LDL was 73  Has appt with ENT 12/23/2022 for chronic sinus issues  HM:  c-scope scheduled for 12/14/2022. [place] scheduled for 11/24/2022.    Patient Active Problem List   Diagnosis Date Noted   Type 2 diabetes mellitus with hyperlipidemia ([place]) 08/18/2022   Hypertension associated with diabetes ([place]) 08/18/2022   Chronic daily headache 08/18/2022   Chronic congestion of paranasal sinus 08/18/2022   HYPERTENSION, BENIGN SYSTEMIC 11/30/2006   IRRITABLE BOWEL SYNDROME 11/30/2006     Current Outpatient Medications on File Prior to Visit  Medication Sig Dispense Refill   amLODipine (NORVASC) 10 MG tablet Take 1 tablet (10 mg total) by mouth daily. 30 tablet 6   Aspirin-Salicylamide-Caffeine (BC HEADACHE POWDER PO) Take 1 packet by mouth daily as needed (headaches).     bisacodyl (DULCOLAX) 10 MG suppository Place 10 mg rectally as needed for moderate constipation.     bismuth subsalicylate (PEPTO  BISMOL) 262 MG chewable tablet Chew 524 mg by mouth as needed.     calcium carbonate (TUMS - DOSED IN MG ELEMENTAL CALCIUM) 500 MG chewable tablet Chew 1 tablet by mouth daily.     Fexofenadine HCl (MUCINEX ALLERGY PO) Take by mouth.     fluticasone (FLONASE) 50 MCG/ACT nasal spray Place 2 sprays into both nostrils daily as needed for allergies or rhinitis. 16 g 1   loratadine (CLARITIN) 10 MG tablet Take 1 tablet (10 mg total) by mouth daily. 30 tablet 3   losartan-hydrochlorothiazide (HYZAAR) 100-25 MG tablet Take 1 tablet by mouth daily. 30 tablet 6   metFORMIN (GLUCOPHAGE) 850 MG tablet Take 1 tablet (850 mg total) by mouth 2 (two) times daily with a meal. 180 tablet 1   oxymetazoline (AFRIN) 0.05 % nasal spray Place 1 spray into both nostrils 2 (two) times daily.     pravastatin (PRAVACHOL) 40 MG tablet Take 1 tablet (40 mg total) by mouth daily. 30 tablet 6   topiramate (TOPAMAX) 25 MG tablet Take 25 mg (1 pill) at bedtime for one week, then increase to 50 mg (2 pills) at bedtime for one week, then take 75 mg (3 pills) at bedtime for one week, then take 100 mg (4 pills) at bedtime and continue on that dose 120 tablet 6   rizatriptan (MAXALT) 10 MG tablet Take 1 tablet (  10 mg total) by mouth as needed for migraine. May repeat in 2 hours if needed. Max dose 2 pills in 24 hours (Patient not taking: Reported on 11/21/2022) 10 tablet 6   No current facility-administered medications on file prior to visit.    Allergies  Allergen Reactions   Codeine Nausea Only    Social History   Socioeconomic History   Marital status: Single    Spouse name: Not on file   Number of children: Not on file   Years of education: Not on file   Highest education level: Not on file  Occupational History   Not on file  Tobacco Use   Smoking status: Former    Packs/day: 0.25    Years: 5.00    Total pack years: 1.25    Types: Cigarettes   Smokeless tobacco: Never  Substance and Sexual Activity   Alcohol  use: No   Drug use: No   Sexual activity: Not Currently    Birth control/protection: None  Other Topics Concern   Not on file  Social History Narrative   Not on file   Social Determinants of Health   Financial Resource Strain: Not on file  Food Insecurity: Not on file  Transportation Needs: Not on file  Physical Activity: Not on file  Stress: Not on file  Social Connections: Not on file  Intimate Partner Violence: Not on file    Family History  Problem Relation Age of Onset   Diabetes Mother    Breast cancer Sister 93   Stroke Sister    Arthritis Maternal Aunt    Colon cancer Neg Hx     Past Surgical History:  Procedure Laterality Date   ABDOMINAL HYSTERECTOMY  10/03/1980   APPENDECTOMY  10/03/1962   UPPER GASTROINTESTINAL ENDOSCOPY  10/04/2003   UPPER GASTROINTESTINAL ENDOSCOPY  10/03/1997   upper gi series    ROS: Review of Systems Negative except as stated above  PHYSICAL EXAM: BP 138/88   Pulse 91   Ht 5' 2"$  (1.575 m)   Wt 154 lb 9.6 oz (70.1 kg)   SpO2 97%   BMI 28.28 kg/m   Wt Readings from Last 3 Encounters:  11/21/22 154 lb 9.6 oz (70.1 kg)  09/19/22 154 lb 6.4 oz (70 kg)  08/18/22 152 lb 9.6 oz (69.2 kg)    Physical Exam  General appearance - alert, well appearing, older African-American female and in no distress Mouth - mucous membranes moist, pharynx normal without lesions Chest - clear to auscultation, no wheezes, rales or rhonchi, symmetric air entry Heart - normal rate, regular rhythm, normal S1, S2, no murmurs, rubs, clicks or gallops Extremities - peripheral pulses normal, no pedal edema, no clubbing or cyanosis      Latest Ref Rng & Units 08/23/2022    8:52 AM  CMP  Glucose 70 - 99 mg/dL 102   BUN 8 - 27 mg/dL 14   Creatinine 0.57 - 1.00 mg/dL 0.76   Sodium 134 - 144 mmol/L 141   Potassium 3.5 - 5.2 mmol/L 3.8   Chloride 96 - 106 mmol/L 99   CO2 20 - 29 mmol/L 21   Calcium 8.7 - 10.3 mg/dL 9.9   Total Protein 6.0 - 8.5 g/dL  7.6   Total Bilirubin 0.0 - 1.2 mg/dL 0.3   Alkaline Phos 44 - 121 IU/L 114   AST 0 - 40 IU/L 34   ALT 0 - 32 IU/L 25    Lipid Panel  Component Value Date/Time   CHOL 145 08/23/2022 0852   TRIG 115 08/23/2022 0852   HDL 51 08/23/2022 0852   CHOLHDL 2.8 08/23/2022 0852   LDLCALC 73 08/23/2022 0852    CBC    Component Value Date/Time   WBC 8.3 08/23/2022 0852   RBC 5.44 (H) 08/23/2022 0852   HGB 15.1 08/23/2022 0852   HCT 45.3 08/23/2022 0852   PLT 354 08/23/2022 0852   MCV 83 08/23/2022 0852   MCH 27.8 08/23/2022 0852   MCHC 33.3 08/23/2022 0852   RDW 13.3 08/23/2022 0852    ASSESSMENT AND PLAN: 1. Hypertension associated with diabetes ([place]) Not at goal. Continue Norvasc 10 mg daily and Cozaar/HCTZ 100/25 mg daily.  Since she is afraid to take the Tapazole for headache prophylaxis, we discussed putting her on Inderal which will kill 2 birds with 1 stone-help with blood pressure control and prophylaxis for headache.  Patient agreeable to giving the medication a try.  Told to let me know if she develops any side effects from the medication. - propranolol (INDERAL) 10 MG tablet; Take 1 tablet (10 mg total) by mouth 2 (two) times daily.  Dispense: 60 tablet; Refill: 6  2. Type 2 diabetes mellitus with hyperlipidemia ([facility]) Last A1c at goal.  Continue metformin.  Encourage healthy eating habits.  Patient plans to start doing some exercise classes remotely with her [employer] gym membership. - POCT glucose (manual entry)  3. Overweight (BMI 25.0-29.9) Discussed and encourage healthy eating habits.  Encouraged her to get in some form of moderate intensity exercise with goal of 150 minutes/week total.  4. Chronic daily headache See #1 above. Advised to stop BC powder completely.  Use the Maxalt for abortive therapy - propranolol (INDERAL) 10 MG tablet; Take 1 tablet (10 mg total) by mouth 2 (two) times daily.  Dispense: 60 tablet; Refill: 6  5. Need for shingles  vaccine Prescription given for her to get first Shingrix vaccine.  Went over possible side effects of the vaccine including injection site reaction. - Zoster Vaccine Adjuvanted [facility]) injection; Inject 0.5 mLs into the muscle once for 1 dose.  Dispense: 1 each; Refill: 0     Patient was given the opportunity to ask questions.  Patient verbalized understanding of the plan and was able to repeat key elements of the plan.   This documentation was completed using Radio producer.  Any transcriptional errors are unintentional.  Orders Placed This Encounter  Procedures   POCT glucose (manual entry)     Requested Prescriptions   Signed Prescriptions Disp Refills   propranolol (INDERAL) 10 MG tablet 60 tablet 6    Sig: Take 1 tablet (10 mg total) by mouth 2 (two) times daily.   Zoster Vaccine Adjuvanted [facility]) injection 1 each 0    Sig: Inject 0.5 mLs into the muscle once for 1 dose.    Return in about 4 months (around 03/22/2023).  Karle Plumber, MD, FACP

## 2022-11-24 ENCOUNTER — Ambulatory Visit: Payer: Medicare Other | Attending: Internal Medicine

## 2022-11-24 VITALS — Ht 61.0 in | Wt 154.0 lb

## 2022-11-24 DIAGNOSIS — Z0000 Encounter for general adult medical examination without abnormal findings: Secondary | ICD-10-CM

## 2022-11-24 NOTE — Patient Instructions (Signed)
Rebecca Glover , Thank you for taking time to come for your Medicare Wellness Visit. I appreciate your ongoing commitment to your health goals. Please review the following plan we discussed and let me know if I can assist you in the future.   These are the goals we discussed:  Goals      Patient Stated     11/24/2022, wants to lose weight        This is a list of the screening recommended for you and due dates:  Health Maintenance  Topic Date Due   Complete foot exam   Never done   Hepatitis C Screening: USPSTF Recommendation to screen - Ages 8-79 yo.  Never done   DTaP/Tdap/Td vaccine (2 - Tdap) 02/01/2015   Colon Cancer Screening  11/30/2020   COVID-19 Vaccine (3 - 2023-24 season) 06/03/2022   Zoster (Shingles) Vaccine (2 of 2) 01/16/2023   Hemoglobin A1C  02/17/2023   Eye exam for diabetics  08/16/2023   Yearly kidney function blood test for diabetes  08/24/2023   Yearly kidney health urinalysis for diabetes  08/24/2023   Medicare Annual Wellness Visit  11/25/2023   Mammogram  05/23/2024   Pneumonia Vaccine  Completed   Flu Shot  Completed   DEXA scan (bone density measurement)  Completed   HPV Vaccine  Aged Out    Advanced directives: Advance directive discussed with you today.   Conditions/risks identified: none  Next appointment: Follow up in one year for your annual wellness visit    Preventive Care 65 Years and Older, Female Preventive care refers to lifestyle choices and visits with your health care provider that can promote health and wellness. What does preventive care include? A yearly physical exam. This is also called an annual well check. Dental exams once or twice a year. Routine eye exams. Ask your health care provider how often you should have your eyes checked. Personal lifestyle choices, including: Daily care of your teeth and gums. Regular physical activity. Eating a healthy diet. Avoiding tobacco and drug use. Limiting alcohol use. Practicing safe  sex. Taking low-dose aspirin every day. Taking vitamin and mineral supplements as recommended by your health care provider. What happens during an annual well check? The services and screenings done by your health care provider during your annual well check will depend on your age, overall health, lifestyle risk factors, and family history of disease. Counseling  Your health care provider may ask you questions about your: Alcohol use. Tobacco use. Drug use. Emotional well-being. Home and relationship well-being. Sexual activity. Eating habits. History of falls. Memory and ability to understand (cognition). Work and work Statistician. Reproductive health. Screening  You may have the following tests or measurements: Height, weight, and BMI. Blood pressure. Lipid and cholesterol levels. These may be checked every 5 years, or more frequently if you are over 58 years old. Skin check. Lung cancer screening. You may have this screening every year starting at age 79 if you have a 30-pack-year history of smoking and currently smoke or have quit within the past 15 years. Fecal occult blood test (FOBT) of the stool. You may have this test every year starting at age 19. Flexible sigmoidoscopy or colonoscopy. You may have a sigmoidoscopy every 5 years or a colonoscopy every 10 years starting at age 2. Hepatitis C blood test. Hepatitis B blood test. Sexually transmitted disease (STD) testing. Diabetes screening. This is done by checking your blood sugar (glucose) after you have not eaten for a while (  fasting). You may have this done every 1-3 years. Bone density scan. This is done to screen for osteoporosis. You may have this done starting at age 96. Mammogram. This may be done every 1-2 years. Talk to your health care provider about how often you should have regular mammograms. Talk with your health care provider about your test results, treatment options, and if necessary, the need for more  tests. Vaccines  Your health care provider may recommend certain vaccines, such as: Influenza vaccine. This is recommended every year. Tetanus, diphtheria, and acellular pertussis (Tdap, Td) vaccine. You may need a Td booster every 10 years. Zoster vaccine. You may need this after age 22. Pneumococcal 13-valent conjugate (PCV13) vaccine. One dose is recommended after age 58. Pneumococcal polysaccharide (PPSV23) vaccine. One dose is recommended after age 9. Talk to your health care provider about which screenings and vaccines you need and how often you need them. This information is not intended to replace advice given to you by your health care provider. Make sure you discuss any questions you have with your health care provider. Document Released: 10/16/2015 Document Revised: 06/08/2016 Document Reviewed: 07/21/2015 Elsevier Interactive Patient Education  2017 [place] Prevention in the Home Falls can cause injuries. They can happen to people of all ages. There are many things you can do to make your home safe and to help prevent falls. What can I do on the outside of my home? Regularly fix the edges of walkways and driveways and fix any cracks. Remove anything that might make you trip as you walk through a door, such as a raised step or threshold. Trim any bushes or trees on the path to your home. Use bright outdoor lighting. Clear any walking paths of anything that might make someone trip, such as rocks or tools. Regularly check to see if handrails are loose or broken. Make sure that both sides of any steps have handrails. Any raised decks and porches should have guardrails on the edges. Have any leaves, snow, or ice cleared regularly. Use sand or salt on walking paths during winter. Clean up any spills in your garage right away. This includes oil or grease spills. What can I do in the bathroom? Use night lights. Install grab bars by the toilet and in the tub and shower.  Do not use towel bars as grab bars. Use non-skid mats or decals in the tub or shower. If you need to sit down in the shower, use a plastic, non-slip stool. Keep the floor dry. Clean up any water that spills on the floor as soon as it happens. Remove soap buildup in the tub or shower regularly. Attach bath mats securely with double-sided non-slip rug tape. Do not have throw rugs and other things on the floor that can make you trip. What can I do in the bedroom? Use night lights. Make sure that you have a light by your bed that is easy to reach. Do not use any sheets or blankets that are too big for your bed. They should not hang down onto the floor. Have a firm chair that has side arms. You can use this for support while you get dressed. Do not have throw rugs and other things on the floor that can make you trip. What can I do in the kitchen? Clean up any spills right away. Avoid walking on wet floors. Keep items that you use a lot in easy-to-reach places. If you need to reach something above you, use  a strong step stool that has a grab bar. Keep electrical cords out of the way. Do not use floor polish or wax that makes floors slippery. If you must use wax, use non-skid floor wax. Do not have throw rugs and other things on the floor that can make you trip. What can I do with my stairs? Do not leave any items on the stairs. Make sure that there are handrails on both sides of the stairs and use them. Fix handrails that are broken or loose. Make sure that handrails are as long as the stairways. Check any carpeting to make sure that it is firmly attached to the stairs. Fix any carpet that is loose or worn. Avoid having throw rugs at the top or bottom of the stairs. If you do have throw rugs, attach them to the floor with carpet tape. Make sure that you have a light switch at the top of the stairs and the bottom of the stairs. If you do not have them, ask someone to add them for you. What else  can I do to help prevent falls? Wear shoes that: Do not have high heels. Have rubber bottoms. Are comfortable and fit you well. Are closed at the toe. Do not wear sandals. If you use a stepladder: Make sure that it is fully opened. Do not climb a closed stepladder. Make sure that both sides of the stepladder are locked into place. Ask someone to hold it for you, if possible. Clearly mark and make sure that you can see: Any grab bars or handrails. First and last steps. Where the edge of each step is. Use tools that help you move around (mobility aids) if they are needed. These include: Canes. Walkers. Scooters. Crutches. Turn on the lights when you go into a dark area. Replace any light bulbs as soon as they burn out. Set up your furniture so you have a clear path. Avoid moving your furniture around. If any of your floors are uneven, fix them. If there are any pets around you, be aware of where they are. Review your medicines with your doctor. Some medicines can make you feel dizzy. This can increase your chance of falling. Ask your doctor what other things that you can do to help prevent falls. This information is not intended to replace advice given to you by your health care provider. Make sure you discuss any questions you have with your health care provider. Document Released: 07/16/2009 Document Revised: 02/25/2016 Document Reviewed: 10/24/2014 Elsevier Interactive Patient Education  2017 [employer].

## 2022-11-24 NOTE — Progress Notes (Signed)
I connected with  Rebecca Glover on 11/24/22 by a  audio enabled telemedicine application and verified that I am speaking with the correct person using two identifiers.  Patient Location: Home  Provider Location: Office/Clinic  I discussed the limitations of evaluation and management by telemedicine. The patient expressed understanding and agreed to proceed.  Subjective:   Rebecca Glover is a 67 y.o. female who presents for an Initial Medicare Annual Wellness Visit.  Review of Systems     Cardiac Risk Factors include: advanced age (>23mn, >>70women);diabetes mellitus;dyslipidemia;hypertension     Objective:    Today's Vitals   11/24/22 0811  Weight: 154 lb (69.9 kg)  Height: 5' 1"$  (1.549 m)   Body mass index is 29.1 kg/m.     11/24/2022    8:18 AM 03/20/2021   11:46 AM 12/01/2015    1:40 PM 11/16/2015    1:03 PM  Advanced Directives  Does Patient Have a Medical Advance Directive? No No No No  Would patient like information on creating a medical advance directive?  No - Patient declined      Current Medications (verified) Outpatient Encounter Medications as of 11/24/2022  Medication Sig   amLODipine (NORVASC) 10 MG tablet Take 1 tablet (10 mg total) by mouth daily.   Aspirin-Salicylamide-Caffeine (BC HEADACHE POWDER PO) Take 1 packet by mouth daily as needed (headaches).   fluticasone (FLONASE) 50 MCG/ACT nasal spray Place 2 sprays into both nostrils daily as needed for allergies or rhinitis.   loratadine (CLARITIN) 10 MG tablet Take 1 tablet (10 mg total) by mouth daily.   losartan-hydrochlorothiazide (HYZAAR) 100-25 MG tablet Take 1 tablet by mouth daily.   metFORMIN (GLUCOPHAGE) 850 MG tablet Take 1 tablet (850 mg total) by mouth 2 (two) times daily with a meal.   pravastatin (PRAVACHOL) 40 MG tablet Take 1 tablet (40 mg total) by mouth daily.   propranolol (INDERAL) 10 MG tablet Take 1 tablet (10 mg total) by mouth 2 (two) times daily.   rizatriptan (MAXALT) 10 MG tablet  Take 1 tablet (10 mg total) by mouth as needed for migraine. May repeat in 2 hours if needed. Max dose 2 pills in 24 hours   topiramate (TOPAMAX) 25 MG tablet Take 25 mg (1 pill) at bedtime for one week, then increase to 50 mg (2 pills) at bedtime for one week, then take 75 mg (3 pills) at bedtime for one week, then take 100 mg (4 pills) at bedtime and continue on that dose   bisacodyl (DULCOLAX) 10 MG suppository Place 10 mg rectally as needed for moderate constipation. (Patient not taking: Reported on 11/24/2022)   bismuth subsalicylate (PEPTO BISMOL) 262 MG chewable tablet Chew 524 mg by mouth as needed. (Patient not taking: Reported on 11/24/2022)   calcium carbonate (TUMS - DOSED IN MG ELEMENTAL CALCIUM) 500 MG chewable tablet Chew 1 tablet by mouth daily. (Patient not taking: Reported on 11/24/2022)   Fexofenadine HCl (MUCINEX ALLERGY PO) Take by mouth. (Patient not taking: Reported on 11/24/2022)   oxymetazoline (AFRIN) 0.05 % nasal spray Place 1 spray into both nostrils 2 (two) times daily. (Patient not taking: Reported on 11/24/2022)   No facility-administered encounter medications on file as of 11/24/2022.    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   Past Surgical History:  Procedure Laterality Date   ABDOMINAL HYSTERECTOMY  10/03/1980   APPENDECTOMY  10/03/1962   UPPER GASTROINTESTINAL ENDOSCOPY  10/04/2003   UPPER GASTROINTESTINAL ENDOSCOPY  10/03/1997   upper gi series   Family History  Problem Relation Age of Onset   Diabetes Mother    Breast cancer Sister 21   Stroke Sister    Arthritis Maternal Aunt    Colon cancer Neg Hx    Social History   Socioeconomic History   Marital status: Single    Spouse name: Not on file   Number of children: Not on file   Years of  education: Not on file   Highest education level: Not on file  Occupational History   Not on file  Tobacco Use   Smoking status: Former    Packs/day: 0.25    Years: 5.00    Total pack years: 1.25    Types: Cigarettes   Smokeless tobacco: Never  Vaping Use   Vaping Use: Never used  Substance and Sexual Activity   Alcohol use: No   Drug use: No   Sexual activity: Not Currently    Birth control/protection: None  Other Topics Concern   Not on file  Social History Narrative   Not on file   Social Determinants of Health   Financial Resource Strain: Low Risk  (11/24/2022)   Overall Financial Resource Strain (CARDIA)    Difficulty of Paying Living Expenses: Not hard at all  Food Insecurity: No Food Insecurity (11/24/2022)   Hunger Vital Sign    Worried About Running Out of Food in the Last Year: Never true    Ran Out of Food in the Last Year: Never true  Transportation Needs: No Transportation Needs (11/24/2022)   PRAPARE - Transportation    Lack of Transportation (Medical): No    Lack of Transportation (Non-Medical): No  Physical Activity: Inactive (11/24/2022)   Exercise Vital Sign    Days of Exercise per Week: 0 days    Minutes of Exercise per Session: 0 min  Stress: No Stress Concern Present (11/24/2022)   [place]    Feeling of Stress : Not at all  Social Connections: Not on file    Tobacco Counseling Counseling given: Not Answered   Clinical Intake:  Pre-visit preparation completed: Yes  Pain : No/denies pain     Nutritional Status: BMI 25 -29 Overweight Nutritional Risks: None Diabetes: Yes  How often do you need to have someone help you when you read instructions, pamphlets, or other written materials from your doctor or pharmacy?: 1 - Never  Diabetic? Yes Nutrition Risk Assessment:  Has the patient had any N/V/D within the last 2 months?  No  Does the patient have any non-healing  wounds?  No  Has the patient had any unintentional weight loss or weight gain?  No   Diabetes:  Is the patient diabetic?  Yes  If diabetic, was a CBG obtained today?  No  Did the patient bring in their glucometer from home?  No  How often do you monitor your CBG's? Does not.   Financial Strains and Diabetes Management:  Are you having any financial strains with the device, your supplies or your medication? No .  Does the patient want to be seen by Chronic Care Management for management of their diabetes?  No  Would the patient like to be referred to a Nutritionist or for Diabetic Management?  No   Diabetic Exams:  Diabetic Eye Exam: Completed 08/15/2022 Diabetic Foot Exam: Overdue, Pt has been advised about the importance in completing this exam. Pt is scheduled for diabetic foot exam on next appointment.   Interpreter Needed?: No  Information entered by :: NAllen LPN   Activities of Daily Living    11/24/2022    8:22 AM 11/10/2022    4:43 PM  In your present state of health, do you have any difficulty performing the following activities:  Hearing? 0 0  Vision? 0 0  Difficulty concentrating or making decisions? 0 0  Walking or climbing stairs? 0 0  Dressing or bathing? 0 0  Doing errands, shopping? 0 0  Preparing Food and eating ? N N  Using the Toilet? N N  In the past six months, have you accidently leaked urine? Y   Do you have problems with loss of bowel control? N N  Managing your Medications? N N  Managing your Finances? N   Housekeeping or managing your Housekeeping? N N    Patient Care Team: Ladell Pier, MD as PCP - General (Internal Medicine)  Indicate any recent Medical Services you may have received from other than [facility] providers in the past year (date may be approximate).     Assessment:   This is a routine wellness examination for Rebecca Glover.  Hearing/Vision screen Vision Screening - Comments:: Regular eye exams, [facility]  Dietary issues and  exercise activities discussed: Current Exercise Habits: The patient does not participate in regular exercise at present   Goals Addressed             This Visit's Progress    Patient Stated       11/24/2022, wants to lose weight       Depression Screen    11/24/2022    8:21 AM 11/21/2022    1:49 PM  PHQ 2/9 Scores  PHQ - 2 Score 1 1    Fall Risk    11/24/2022    8:20 AM 11/21/2022    1:49 PM 11/10/2022    4:43 PM 08/18/2022    1:48 PM  Fall Risk   Falls in the past year? 1 0 1 0  Comment slipped in [facility]     Number falls in past yr: 0 0 0 0  Injury with Fall? 0 0 0 0  Risk for fall due to : Medication side effect No Fall Risks  No Fall Risks  Follow up Falls prevention discussed;Education provided;Falls evaluation completed       FALL RISK PREVENTION PERTAINING TO THE HOME:  Any stairs in or around the home? No  If so, are there any without handrails?  N/a Home free of loose throw rugs in walkways, pet beds, electrical cords, etc? Yes  Adequate lighting in your home to reduce risk of falls? Yes   ASSISTIVE DEVICES UTILIZED TO PREVENT FALLS:  Life alert? No  Use of a cane, walker or w/c? No  Grab bars in the bathroom? Yes  Shower chair or bench in shower? No  Elevated toilet seat or a handicapped toilet? No   TIMED UP AND GO:  Was the test performed? No .      Cognitive Function:        11/24/2022    8:23 AM  6CIT Screen  What Year? 0 points  What month? 0 points  What time? 0 points  Count back from 20 2 points  Months in reverse 0  points  Repeat phrase 6 points  Total Score 8 points    Immunizations Immunization History  Administered Date(s) Administered   Fluad Quad(high Dose 65+) 08/18/2022   Moderna Sars-Covid-2 Vaccination 01/22/2020, 02/19/2020   PNEUMOCOCCAL CONJUGATE-20 08/18/2022   Td 01/31/2005   Zoster Recombinat (Shingrix) 11/21/2022    TDAP status: Due, Education has been provided regarding the importance of this vaccine.  Advised may receive this vaccine at local pharmacy or Health Dept. Aware to provide a copy of the vaccination record if obtained from local pharmacy or Health Dept. Verbalized acceptance and understanding.  Flu Vaccine status: Up to date  Pneumococcal vaccine status: Up to date  Covid-19 vaccine status: Completed vaccines  Qualifies for Shingles Vaccine? Yes   Zostavax completed No   Shingrix Completed?: needs second dose  Screening Tests Health Maintenance  Topic Date Due   Medicare Annual Wellness (AWV)  Never done   FOOT EXAM  Never done   Hepatitis C Screening  Never done   DTaP/Tdap/Td (2 - Tdap) 02/01/2015   COLONOSCOPY (Pts 45-40yr Insurance coverage will need to be confirmed)  11/30/2020   COVID-19 Vaccine (3 - 2023-24 season) 06/03/2022   Zoster Vaccines- Shingrix (2 of 2) 01/16/2023   HEMOGLOBIN A1C  02/17/2023   OPHTHALMOLOGY EXAM  08/16/2023   Diabetic kidney evaluation - eGFR measurement  08/24/2023   Diabetic kidney evaluation - Urine ACR  08/24/2023   MAMMOGRAM  05/23/2024   Pneumonia Vaccine 67 Years old  Completed   INFLUENZA VACCINE  Completed   DEXA SCAN  Completed   HPV VACCINES  Aged Out    Health Maintenance  Health Maintenance Due  Topic Date Due   Medicare Annual Wellness (AWV)  Never done   FOOT EXAM  Never done   Hepatitis C Screening  Never done   DTaP/Tdap/Td (2 - Tdap) 02/01/2015   COLONOSCOPY (Pts 45-487yrInsurance coverage will need to be confirmed)  11/30/2020   COVID-19 Vaccine (3 - 2023-24 season) 06/03/2022    Colorectal cancer screening: scheduled for 12/14/2022  Mammogram status: Completed 05/23/2022. Repeat every year  Bone Density status: Completed 03/29/2011.   Lung Cancer Screening: (Low Dose CT Chest recommended if Age 67-80ears, 30 pack-year currently smoking OR have quit w/in 15years.) does not qualify.   Lung Cancer Screening Referral: no  Additional Screening:  Hepatitis C Screening: does qualify;   Vision  Screening: Recommended annual ophthalmology exams for early detection of glaucoma and other disorders of the eye. Is the patient up to date with their annual eye exam?  Yes  Who is the provider or what is the name of the office in which the patient attends annual eye exams? [facility]  If pt is not established with a provider, would they like to be referred to a provider to establish care? No .   Dental Screening: Recommended annual dental exams for proper oral hygiene  Community Resource Referral / Chronic Care Management: CRR required this visit?  No   CCM required this visit?  No      Plan:     I have personally reviewed and noted the following in the patient's chart:   Medical and social history Use of alcohol, tobacco or illicit drugs  Current medications and supplements including opioid prescriptions. Patient is not currently taking opioid prescriptions. Functional ability and status Nutritional status Physical activity Advanced directives List of other physicians Hospitalizations, surgeries, and ER visits in previous 12 months Vitals Screenings to include cognitive, depression, and  falls Referrals and appointments  In addition, I have reviewed and discussed with patient certain preventive protocols, quality metrics, and best practice recommendations. A written personalized care plan for preventive services as well as general preventive health recommendations were provided to patient.     Kellie Simmering, LPN   [id number]   Nurse Notes: none  Due to this being a virtual visit, the after visit summary with patients personalized plan was offered to patient via mail or my-chart.  Patient would like to access on my-chart

## 2022-12-12 NOTE — Progress Notes (Signed)
Rebecca Glover    [id number]    1955/10/07  Primary Care Physician:Johnson, Dalbert Batman, MD  Referring Physician: Ladell Pier, MD [address] [place],  [place] [address]   Chief complaint:  No chief complaint on file.    HPI: Rebecca Glover is a 67 y.o. female presenting to clinic today for consult for constipation and reflux at the request of Dr. Karle Plumber.  Today,   GI Hx:  Colonoscopy 12/01/15  1. Sessile polyp ranging between 3-45m in size was found at the hepatic flexure; polypectomy was performed with cold forceps  2. Sessile polyp ranging between 5-965min size was found in the sigmoid colon; polypectomy was performed with a cold snare  3. Mild diverticulosis was noted throughout the entire examined colon  EGD 06/01/2004  -Normal Examination     Current Outpatient Medications:    amLODipine (NORVASC) 10 MG tablet, Take 1 tablet (10 mg total) by mouth daily., Disp: 30 tablet, Rfl: 6   Aspirin-Salicylamide-Caffeine (BC HEADACHE POWDER PO), Take 1 packet by mouth daily as needed (headaches)., Disp: , Rfl:    bisacodyl (DULCOLAX) 10 MG suppository, Place 10 mg rectally as needed for moderate constipation. (Patient not taking: Reported on 11/24/2022), Disp: , Rfl:    bismuth subsalicylate (PEPTO BISMOL) 262 MG chewable tablet, Chew 524 mg by mouth as needed. (Patient not taking: Reported on 11/24/2022), Disp: , Rfl:    calcium carbonate (TUMS - DOSED IN MG ELEMENTAL CALCIUM) 500 MG chewable tablet, Chew 1 tablet by mouth daily. (Patient not taking: Reported on 11/24/2022), Disp: , Rfl:    Fexofenadine HCl (MUCINEX ALLERGY PO), Take by mouth. (Patient not taking: Reported on 11/24/2022), Disp: , Rfl:    fluticasone (FLONASE) 50 MCG/ACT nasal spray, Place 2 sprays into both nostrils daily as needed for allergies or rhinitis., Disp: 16 g, Rfl: 1   loratadine (CLARITIN) 10 MG tablet, Take 1 tablet (10 mg total) by mouth daily., Disp: 30 tablet, Rfl: 3    losartan-hydrochlorothiazide (HYZAAR) 100-25 MG tablet, Take 1 tablet by mouth daily., Disp: 30 tablet, Rfl: 6   metFORMIN (GLUCOPHAGE) 850 MG tablet, Take 1 tablet (850 mg total) by mouth 2 (two) times daily with a meal., Disp: 180 tablet, Rfl: 1   oxymetazoline (AFRIN) 0.05 % nasal spray, Place 1 spray into both nostrils 2 (two) times daily. (Patient not taking: Reported on 11/24/2022), Disp: , Rfl:    pravastatin (PRAVACHOL) 40 MG tablet, Take 1 tablet (40 mg total) by mouth daily., Disp: 30 tablet, Rfl: 6   propranolol (INDERAL) 10 MG tablet, Take 1 tablet (10 mg total) by mouth 2 (two) times daily., Disp: 60 tablet, Rfl: 6   rizatriptan (MAXALT) 10 MG tablet, Take 1 tablet (10 mg total) by mouth as needed for migraine. May repeat in 2 hours if needed. Max dose 2 pills in 24 hours, Disp: 10 tablet, Rfl: 6   topiramate (TOPAMAX) 25 MG tablet, Take 25 mg (1 pill) at bedtime for one week, then increase to 50 mg (2 pills) at bedtime for one week, then take 75 mg (3 pills) at bedtime for one week, then take 100 mg (4 pills) at bedtime and continue on that dose, Disp: 120 tablet, Rfl: 6   Allergies as of 12/14/2022 - Review Complete 11/24/2022  Allergen Reaction Noted   Codeine Nausea Only 06/08/2011    Past Medical History:  Diagnosis Date   Allergy  Diabetes mellitus    GERD (gastroesophageal reflux disease)    Hyperlipidemia    Hypertension    Neuromuscular disorder ([place]) 10/03/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    Past Surgical History:  Procedure Laterality Date   ABDOMINAL HYSTERECTOMY  10/03/1980   APPENDECTOMY  10/03/1962   UPPER GASTROINTESTINAL ENDOSCOPY  10/04/2003   UPPER GASTROINTESTINAL ENDOSCOPY  10/03/1997   upper gi series    Family History  Problem Relation Age of Onset   Diabetes Mother    Breast cancer Sister 44   Stroke Sister    Arthritis Maternal Aunt    Colon cancer Neg Hx     Social History   Socioeconomic History   Marital status: Single    Spouse name: Not on  file   Number of children: Not on file   Years of education: Not on file   Highest education level: Not on file  Occupational History   Not on file  Tobacco Use   Smoking status: Former    Packs/day: 0.25    Years: 5.00    Total pack years: 1.25    Types: Cigarettes   Smokeless tobacco: Never  Vaping Use   Vaping Use: Never used  Substance and Sexual Activity   Alcohol use: No   Drug use: No   Sexual activity: Not Currently    Birth control/protection: None  Other Topics Concern   Not on file  Social History Narrative   Not on file   Social Determinants of Health   Financial Resource Strain: Low Risk  (11/24/2022)   Overall Financial Resource Strain (CARDIA)    Difficulty of Paying Living Expenses: Not hard at all  Food Insecurity: No Food Insecurity (11/24/2022)   Hunger Vital Sign    Worried About Running Out of Food in the Last Year: Never true    Ran Out of Food in the Last Year: Never true  Transportation Needs: No Transportation Needs (11/24/2022)   PRAPARE - Transportation    Lack of Transportation (Medical): No    Lack of Transportation (Non-Medical): No  Physical Activity: Inactive (11/24/2022)   Exercise Vital Sign    Days of Exercise per Week: 0 days    Minutes of Exercise per Session: 0 min  Stress: No Stress Concern Present (11/24/2022)   [place]    Feeling of Stress : Not at all  Social Connections: Not on file  Intimate Partner Violence: Not on file      Review of systems: Review of Systems    Physical Exam: There were no vitals filed for this visit. There is no height or weight on file to calculate BMI.  General: well-appearing ***  Eyes: sclera anicteric, no redness ENT: oral mucosa moist without lesions, no cervical or supraclavicular lymphadenopathy CV: RRR, no JVD, no peripheral edema Resp: clear to auscultation bilaterally, normal RR and effort noted GI: soft, no  tenderness, with active bowel sounds. No guarding or palpable organomegaly noted. Skin; warm and dry, no rash or jaundice noted Neuro: awake, alert and oriented x 3. Normal gross motor function and fluent speech   Data Reviewed:  Reviewed labs, radiology imaging, old records and pertinent past GI work up   Assessment and Plan/Recommendations:  ***  This visit required *** minutes of patient care (this includes precharting, chart review, review of results, face-to-face time used for counseling as well as treatment plan and follow-up. The patient was provided an opportunity to ask questions and all  were answered. The patient agreed with the plan and demonstrated an understanding of the instructions.  Damaris Hippo , MD  CC: Ladell Pier, MD   Joretta Bachelor Johnston Ebbs as a scribe for Harl Bowie, MD.,have documented all relevant documentation on the behalf of Harl Bowie, MD,as directed by  Harl Bowie, MD while in the presence of Harl Bowie, MD.   I, Harl Bowie, MD, have reviewed all documentation for this visit. The documentation on 12/12/22 for the exam, diagnosis, procedures, and orders are all accurate and complete. ***

## 2022-12-14 ENCOUNTER — Encounter: Payer: Self-pay | Admitting: Gastroenterology

## 2022-12-14 ENCOUNTER — Ambulatory Visit: Payer: Medicare Other | Admitting: Gastroenterology

## 2022-12-14 VITALS — BP 130/90 | HR 96 | Ht 61.0 in | Wt 154.2 lb

## 2022-12-14 DIAGNOSIS — Z86010 Personal history of colonic polyps: Secondary | ICD-10-CM

## 2022-12-14 DIAGNOSIS — F1990 Other psychoactive substance use, unspecified, uncomplicated: Secondary | ICD-10-CM | POA: Diagnosis not present

## 2022-12-14 DIAGNOSIS — R12 Heartburn: Secondary | ICD-10-CM | POA: Diagnosis not present

## 2022-12-14 DIAGNOSIS — R1013 Epigastric pain: Secondary | ICD-10-CM

## 2022-12-14 MED ORDER — PANTOPRAZOLE SODIUM 40 MG PO TBEC: 40.0000 mg | DELAYED_RELEASE_TABLET | Freq: Every day | ORAL | 3 refills | Status: DC

## 2022-12-14 MED ORDER — NA SULFATE-K SULFATE-MG SULF 17.5-3.13-1.6 GM/177ML PO SOLN: 1.0000 | Freq: Once | ORAL | 0 refills | Status: AC

## 2022-12-14 NOTE — Patient Instructions (Addendum)
You have been scheduled for an endoscopy and colonoscopy. Please follow the written instructions given to you at your visit today. Please pick up your prep supplies at the pharmacy within the next 1-3 days. If you use inhalers (even only as needed), please bring them with you on the day of your procedure.   Limit use of BC Powders   We have sent the following medications to your pharmacy for you to pick up at your convenience:  Suprep Pantoprazole  Due to recent changes in healthcare laws, you may see the results of your imaging and laboratory studies on MyChart before your provider has had a chance to review them.  We understand that in some cases there may be results that are confusing or concerning to you. Not all laboratory results come back in the same time frame and the provider may be waiting for multiple results in order to interpret others.  Please give [place] 48 hours in order for your provider to thoroughly review all the results before contacting the office for clarification of your results.    _______________________________________________________  If your blood pressure at your visit was 140/90 or greater, please contact your primary care physician to follow up on this.  _______________________________________________________  If you are age 40 or older, your body mass index should be between 23-30. Your Body mass index is 29.15 kg/m. If this is out of the aforementioned range listed, please consider follow up with your Primary Care Provider.  If you are age 16 or younger, your body mass index should be between 19-25. Your Body mass index is 29.15 kg/m. If this is out of the aformentioned range listed, please consider follow up with your Primary Care Provider.   ________________________________________________________  The [facility] providers would like to encourage you to use [facility] to communicate with providers for non-urgent requests or questions.  Due to long hold times on  the telephone, sending your provider a message by [facility] may be a faster and more efficient way to get a response.  Please allow 48 business hours for a response.  Please remember that this is for non-urgent requests.  _______________________________________________________   Thank you for choosing [facility]  Kavitha Nandigam,MD

## 2022-12-18 ENCOUNTER — Other Ambulatory Visit: Payer: Self-pay | Admitting: Internal Medicine

## 2022-12-18 DIAGNOSIS — J329 Chronic sinusitis, unspecified: Secondary | ICD-10-CM

## 2022-12-22 DIAGNOSIS — R519 Headache, unspecified: Secondary | ICD-10-CM | POA: Diagnosis not present

## 2022-12-22 DIAGNOSIS — I10 Essential (primary) hypertension: Secondary | ICD-10-CM | POA: Diagnosis not present

## 2023-01-16 ENCOUNTER — Telehealth: Payer: Self-pay | Admitting: *Deleted

## 2023-01-16 ENCOUNTER — Ambulatory Visit (AMBULATORY_SURGERY_CENTER): Payer: Medicare Other | Admitting: Gastroenterology

## 2023-01-16 ENCOUNTER — Encounter: Payer: Self-pay | Admitting: Gastroenterology

## 2023-01-16 VITALS — BP 142/85 | HR 84 | Temp 97.3°F | Resp 13 | Ht 61.0 in | Wt 154.0 lb

## 2023-01-16 DIAGNOSIS — F1990 Other psychoactive substance use, unspecified, uncomplicated: Secondary | ICD-10-CM

## 2023-01-16 DIAGNOSIS — R1013 Epigastric pain: Secondary | ICD-10-CM

## 2023-01-16 DIAGNOSIS — K2100 Gastro-esophageal reflux disease with esophagitis, without bleeding: Secondary | ICD-10-CM | POA: Diagnosis not present

## 2023-01-16 DIAGNOSIS — K319 Disease of stomach and duodenum, unspecified: Secondary | ICD-10-CM | POA: Diagnosis not present

## 2023-01-16 DIAGNOSIS — D120 Benign neoplasm of cecum: Secondary | ICD-10-CM | POA: Diagnosis not present

## 2023-01-16 DIAGNOSIS — Z09 Encounter for follow-up examination after completed treatment for conditions other than malignant neoplasm: Secondary | ICD-10-CM

## 2023-01-16 DIAGNOSIS — I10 Essential (primary) hypertension: Secondary | ICD-10-CM | POA: Diagnosis not present

## 2023-01-16 DIAGNOSIS — D123 Benign neoplasm of transverse colon: Secondary | ICD-10-CM

## 2023-01-16 DIAGNOSIS — K257 Chronic gastric ulcer without hemorrhage or perforation: Secondary | ICD-10-CM | POA: Diagnosis not present

## 2023-01-16 DIAGNOSIS — E119 Type 2 diabetes mellitus without complications: Secondary | ICD-10-CM | POA: Diagnosis not present

## 2023-01-16 DIAGNOSIS — Z86010 Personal history of colonic polyps: Secondary | ICD-10-CM

## 2023-01-16 DIAGNOSIS — R12 Heartburn: Secondary | ICD-10-CM

## 2023-01-16 HISTORY — PX: COLONOSCOPY WITH ESOPHAGOGASTRODUODENOSCOPY (EGD): SHX5779

## 2023-01-16 MED ORDER — PANTOPRAZOLE SODIUM 40 MG PO TBEC: 40.0000 mg | DELAYED_RELEASE_TABLET | Freq: Two times a day (BID) | ORAL | 3 refills | Status: DC

## 2023-01-16 MED ORDER — SODIUM CHLORIDE 0.9 % IV SOLN: 500.0000 mL | Freq: Once | INTRAVENOUS | Status: AC

## 2023-01-16 NOTE — Progress Notes (Signed)
Uneventful anesthetic. Report to pacu rn. Vss. Care resumed by rn. 

## 2023-01-16 NOTE — Telephone Encounter (Signed)
Patient called back and stated her stool is still not clear. Requesting a call back to see what she needs to do. Please advise.

## 2023-01-16 NOTE — Op Note (Signed)
[facility] Patient Name: Rebecca Glover Procedure Date: 01/16/2023 1:54 PM MRN: [id number] Endoscopist: Napoleon Form , MD, [id number] Age: 67 Referring MD:  Date of Birth: 1955-10-29 Gender: Female Account #: [id number] Procedure:                Colonoscopy Indications:              High risk colon cancer surveillance: Personal                            history of colonic polyps, High risk colon cancer                            surveillance: Personal history of adenoma less than                            10 mm in size Medicines:                Monitored Anesthesia Care Procedure:                Pre-Anesthesia Assessment:                           - Prior to the procedure, a History and Physical                            was performed, and patient medications and                            allergies were reviewed. The patient's tolerance of                            previous anesthesia was also reviewed. The risks                            and benefits of the procedure and the sedation                            options and risks were discussed with the patient.                            All questions were answered, and informed consent                            was obtained. Prior Anticoagulants: The patient has                            taken no anticoagulant or antiplatelet agents. ASA                            Grade Assessment: III - A patient with severe                            systemic disease. After reviewing the risks and  benefits, the patient was deemed in satisfactory                            condition to undergo the procedure.                           After obtaining informed consent, the colonoscope                            was passed under direct vision. Throughout the                            procedure, the patient's blood pressure, pulse, and                            oxygen saturations were monitored  continuously. The                            Olympus [id number]-[id number] (#[id number]) Colonoscope was                            introduced through the anus and advanced to the the                            cecum, identified by appendiceal orifice and                            ileocecal valve. The colonoscopy was performed                            without difficulty. The patient tolerated the                            procedure well. The quality of the bowel                            preparation was good. The ileocecal valve,                            appendiceal orifice, and rectum were photographed. Scope In: 2:43:31 PM Scope Out: 3:00:14 PM Scope Withdrawal Time: 0 hours 8 minutes 15 seconds  Total Procedure Duration: 0 hours 16 minutes 43 seconds  Findings:                 The perianal and digital rectal examinations were                            normal.                           A 2 mm polyp was found in the cecum. The polyp was                            sessile. The polyp was removed with a cold biopsy  forceps. Resection and retrieval were complete.                           Two sessile polyps were found in the transverse                            colon. The polyps were 5 to 7 mm in size. These                            polyps were removed with a cold snare. Resection                            and retrieval were complete.                           Scattered large-mouthed, medium-mouthed and                            small-mouthed diverticula were found in the sigmoid                            colon, descending colon, transverse colon and                            ascending colon. There was evidence of an impacted                            diverticulum.                           Non-bleeding external and internal hemorrhoids were                            found during retroflexion. The hemorrhoids were                             medium-sized. Complications:            No immediate complications. Estimated Blood Loss:     Estimated blood loss was minimal. Impression:               - One 2 mm polyp in the cecum, removed with a cold                            biopsy forceps. Resected and retrieved.                           - Two 5 to 7 mm polyps in the transverse colon,                            removed with a cold snare. Resected and retrieved.                           - Moderate diverticulosis in the sigmoid colon, in  the descending colon, in the transverse colon and                            in the ascending colon. There was evidence of an                            impacted diverticulum.                           - Non-bleeding external and internal hemorrhoids. Recommendation:           - Resume previous diet.                           - Continue present medications.                           - Await pathology results.                           - Repeat colonoscopy in 3 - 5 years for                            surveillance based on pathology results. Napoleon Form, MD 01/16/2023 3:10:07 PM This report has been signed electronically.

## 2023-01-16 NOTE — Op Note (Signed)
[facility] Patient Name: Anansa Pruiett Procedure Date: 01/16/2023 2:02 PM MRN: [id number] Endoscopist: Napoleon Form , MD, [id number] Age: 67 Referring MD:  Date of Birth: [birth year] Gender: Female Account #: [id number] Procedure:                Upper GI endoscopy Indications:              Epigastric abdominal pain, Heartburn, Dyspepsia Medicines:                Monitored Anesthesia Care Procedure:                Pre-Anesthesia Assessment:                           - Prior to the procedure, a History and Physical                            was performed, and patient medications and                            allergies were reviewed. The patient's tolerance of                            previous anesthesia was also reviewed. The risks                            and benefits of the procedure and the sedation                            options and risks were discussed with the patient.                            All questions were answered, and informed consent                            was obtained. Prior Anticoagulants: The patient has                            taken no anticoagulant or antiplatelet agents. ASA                            Grade Assessment: III - A patient with severe                            systemic disease. After reviewing the risks and                            benefits, the patient was deemed in satisfactory                            condition to undergo the procedure.                           After obtaining informed consent, the endoscope was  passed under direct vision. Throughout the                            procedure, the patient's blood pressure, pulse, and                            oxygen saturations were monitored continuously. The                            GIF [id number] #[id number] was introduced through the                            mouth, and advanced to the second part of duodenum.                             The upper GI endoscopy was accomplished without                            difficulty. The patient tolerated the procedure                            well. Scope In: Scope Out: Findings:                 LA Grade C (one or more mucosal breaks continuous                            between tops of 2 or more mucosal folds, less than                            75% circumference) esophagitis with no bleeding was                            found 36 to 38 cm from the incisors.                           A small hiatal hernia was present.                           Three non-bleeding cratered and superficial gastric                            ulcers with no stigmata of bleeding were found in                            the prepyloric region of the stomach and at the                            pylorus. The largest lesion was 3 mm in largest                            dimension. Biopsies were taken with a cold forceps  for Helicobacter pylori testing.                           The cardia and gastric fundus were normal on                            retroflexion.                           The examined duodenum was normal. Complications:            No immediate complications. Estimated Blood Loss:     Estimated blood loss was minimal. Impression:               - LA Grade C reflux esophagitis with no bleeding.                           - Small hiatal hernia.                           - Non-bleeding gastric ulcers with no stigmata of                            bleeding. Biopsied.                           - Normal examined duodenum. Recommendation:           - Patient has a contact number available for                            emergencies. The signs and symptoms of potential                            delayed complications were discussed with the                            patient. Return to normal activities tomorrow.                            Written discharge instructions  were provided to the                            patient.                           - Resume previous diet.                           - Continue present medications.                           - Await pathology results.                           - Use Protonix (pantoprazole) 40 mg PO BID for 3  months.                           - No ibuprofen, naproxen, or other non-steroidal                            anti-inflammatory drugs. Napoleon Form, MD 01/16/2023 3:19:06 PM This report has been signed electronically.

## 2023-01-16 NOTE — Progress Notes (Unsigned)
[facility] History and Physical   Primary Care Physician:  Marcine Matar, MD   Reason for Procedure:  Epigastric pain, dyspepsia, heartburn, h/o colon polyps  Plan:    EGD and colonoscopy with possible interventions as needed     HPI: Rebecca Glover is a very pleasant 67 y.o. female here for EGD and colonoscopy, for epigastric pain, heartburn, dyspepsia, and h/o colon polyps.  The risks and benefits as well as alternatives of endoscopic procedure(s) have been discussed and reviewed. All questions answered. The patient agrees to proceed.    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 Authorizing Provider  amLODipine (NORVASC) 10 MG tablet Take 1 tablet (10 mg total) by mouth daily. 08/18/22  Yes Marcine Matar, MD  Aspirin-Salicylamide-Caffeine (BC HEADACHE POWDER PO) Take 1 packet by mouth daily as needed (headaches).   Yes [provider]  losartan-hydrochlorothiazide (HYZAAR) 100-25 MG tablet Take 1 tablet by mouth daily. 08/18/22  Yes Marcine Matar, MD  metFORMIN (GLUCOPHAGE) 850 MG tablet Take 1 tablet (850 mg total) by mouth 2 (two) times daily with a meal. 08/18/22  Yes Marcine Matar, MD  fluticasone [facility]) 50 MCG/ACT nasal spray Place 2 sprays into both nostrils daily as needed for allergies or rhinitis. 08/18/22   Marcine Matar, MD  loratadine (CLARITIN) 10 MG  tablet TAKE 1 TABLET BY MOUTH EVERY DAY 12/19/22   Marcine Matar, MD  oxymetazoline (AFRIN) 0.05 % nasal spray Place 1 spray into both nostrils 2 (two) times daily.    [provider]  pantoprazole (PROTONIX) 40 MG tablet Take 1 tablet (40 mg total) by mouth daily. 12/14/22   Napoleon Form, MD  pravastatin (PRAVACHOL) 40 MG tablet Take 1 tablet (40 mg total) by mouth daily. 08/18/22   Marcine Matar, MD  propranolol (INDERAL) 10 MG tablet Take 1 tablet (10 mg total) by mouth 2 (two) times daily. 11/21/22   Marcine Matar, MD  rizatriptan (MAXALT) 10 MG tablet Take 1 tablet (10 mg total) by mouth as needed for migraine. May repeat in 2 hours if needed. Max dose 2 pills in 24 hours 09/19/22   Ocie Doyne, MD  topiramate (TOPAMAX) 25 MG tablet Take 25 mg (1 pill) at bedtime for one week, then increase to 50 mg (2 pills) at bedtime for one week, then take 75 mg (3 pills) at bedtime for one week, then take 100 mg (4 pills) at bedtime and continue on that dose 09/19/22   Ocie Doyne, MD    Current Outpatient Medications  Medication Sig Dispense Refill   amLODipine (NORVASC) 10 MG tablet Take 1 tablet (10 mg total) by mouth daily. 30 tablet 6   Aspirin-Salicylamide-Caffeine (BC HEADACHE POWDER PO) Take 1 packet by mouth daily as needed (headaches).     losartan-hydrochlorothiazide (HYZAAR) 100-25 MG tablet Take 1 tablet by mouth daily. 30 tablet  6   metFORMIN (GLUCOPHAGE) 850 MG tablet Take 1 tablet (850 mg total) by mouth 2 (two) times daily with a meal. 180 tablet 1   fluticasone (FLONASE) 50 MCG/ACT nasal spray Place 2 sprays into both nostrils daily as needed for allergies or rhinitis. 16 g 1   loratadine (CLARITIN) 10 MG tablet TAKE 1 TABLET BY MOUTH EVERY DAY 90 tablet 0   oxymetazoline (AFRIN) 0.05 % nasal spray Place 1 spray into both nostrils 2 (two) times daily.     pantoprazole (PROTONIX) 40 MG tablet Take 1 tablet (40 mg total) by mouth daily. 90 tablet 3    pravastatin (PRAVACHOL) 40 MG tablet Take 1 tablet (40 mg total) by mouth daily. 30 tablet 6   propranolol (INDERAL) 10 MG tablet Take 1 tablet (10 mg total) by mouth 2 (two) times daily. 60 tablet 6   rizatriptan (MAXALT) 10 MG tablet Take 1 tablet (10 mg total) by mouth as needed for migraine. May repeat in 2 hours if needed. Max dose 2 pills in 24 hours 10 tablet 6   topiramate (TOPAMAX) 25 MG tablet Take 25 mg (1 pill) at bedtime for one week, then increase to 50 mg (2 pills) at bedtime for one week, then take 75 mg (3 pills) at bedtime for one week, then take 100 mg (4 pills) at bedtime and continue on that dose 120 tablet 6   Current Facility-Administered Medications  Medication Dose Route Frequency Provider Last Rate Last Admin   0.9 %  sodium chloride infusion  500 mL Intravenous Once Napoleon Form, MD        Allergies as of 01/16/2023 - Review Complete 01/16/2023  Allergen Reaction Noted   Codeine Nausea Only 06/08/2011    Family History  Problem Relation Age of Onset   Diabetes Mother    Breast cancer Sister 76   Stroke Sister    Arthritis Maternal Aunt    Colon cancer Neg Hx     Social History   Socioeconomic History   Marital status: Single    Spouse name: Not on file   Number of children: Not on file   Years of education: Not on file   Highest education level: Not on file  Occupational History   Not on file  Tobacco Use   Smoking status: Former    Packs/day: 0.25    Years: 5.00    Additional pack years: 0.00    Total pack years: 1.25    Types: Cigarettes   Smokeless tobacco: Never  Vaping Use   Vaping Use: Never used  Substance and Sexual Activity   Alcohol use: No   Drug use: No   Sexual activity: Not Currently    Birth control/protection: None  Other Topics Concern   Not on file  Social History Narrative   Not on file   Social Determinants of Health   Financial Resource Strain: Low Risk  (11/24/2022)   Overall Financial Resource Strain  (CARDIA)    Difficulty of Paying Living Expenses: Not hard at all  Food Insecurity: No Food Insecurity (11/24/2022)   Hunger Vital Sign    Worried About Running Out of Food in the Last Year: Never true    Ran Out of Food in the Last Year: Never true  Transportation Needs: No Transportation Needs (11/24/2022)   PRAPARE - Transportation    Lack of Transportation (Medical): No    Lack of Transportation (Non-Medical): No  Physical Activity: Inactive (11/24/2022)   Exercise Vital Sign  Days of Exercise per Week: 0 days    Minutes of Exercise per Session: 0 min  Stress: No Stress Concern Present (11/24/2022)   [employer] of Occupational Health - Occupational Stress Questionnaire    Feeling of Stress : Not at all  Social Connections: Not on file  Intimate Partner Violence: Not on file    Review of Systems:  All other review of systems negative except as mentioned in the HPI.  Physical Exam: Vital signs in last 24 hours: Blood Pressure (Abnormal) 148/84   Pulse (Abnormal) 108   Temperature (Abnormal) 97.3 F (36.3 C) (Temporal)   Height  (1.549 m)   Weight 154 lb (69.9 kg)   Oxygen Saturation 97%   Body Mass Index 29.10 kg/m  General:   Alert, NAD Lungs:  Clear .   Heart:  Regular rate and rhythm Abdomen:  Soft, nontender and nondistended. Neuro/Psych:  Alert and cooperative. Normal mood and affect. A and O x 3  Reviewed labs, radiology imaging, old records and pertinent past GI work up  Patient is appropriate for planned procedure(s) and anesthesia in an ambulatory setting   K. Scherry Ran , MD [phone]

## 2023-01-16 NOTE — Telephone Encounter (Signed)
PT describes her stools as light yellow.  She says that it is clearer than the last time she went.  I advised her to keep drinking water until her cut off time of 11:30 am.  She will plan to arrive at 1:30 for her 2:30 procedure.

## 2023-01-16 NOTE — Progress Notes (Signed)
Pt's states no medical or surgical changes since previsit or office visit. 

## 2023-01-16 NOTE — Patient Instructions (Signed)
Thank you for letting us take care of your healthcare needs today. Please see handouts given to you on Polyps, Diverticulosis, Hemorrhoids , esophagitis and Hiatal hernia. New prescription for Pantoprazole 40 mg twice a day called into your pharmacy.    YOU HAD AN ENDOSCOPIC PROCEDURE TODAY AT THE [facility]:   Refer to the procedure report that was given to you for any specific questions about what was found during the examination.  If the procedure report does not answer your questions, please call your gastroenterologist to clarify.  If you requested that your care partner not be given the details of your procedure findings, then the procedure report has been included in a sealed envelope for you to review at your convenience later.  YOU SHOULD EXPECT: Some feelings of bloating in the abdomen. Passage of more gas than usual.  Walking can help get rid of the air that was put into your GI tract during the procedure and reduce the bloating. If you had a lower endoscopy (such as a colonoscopy or flexible sigmoidoscopy) you may notice spotting of blood in your stool or on the toilet paper. If you underwent a bowel prep for your procedure, you may not have a normal bowel movement for a few days.  Please Note:  You might notice some irritation and congestion in your nose or some drainage.  This is from the oxygen used during your procedure.  There is no need for concern and it should clear up in a day or so.  SYMPTOMS TO REPORT IMMEDIATELY:  Following lower endoscopy (colonoscopy or flexible sigmoidoscopy):  Excessive amounts of blood in the stool  Significant tenderness or worsening of abdominal pains  Swelling of the abdomen that is new, acute  Fever of 100F or higher  Following upper endoscopy (EGD)  Vomiting of blood or coffee ground material  New chest pain or pain under the shoulder blades  Painful or persistently difficult swallowing  New shortness of breath  Fever of 100F  or higher  Black, tarry-looking stools  For urgent or emergent issues, a gastroenterologist can be reached at any hour by calling [phone]. Do not use MyChart messaging for urgent concerns.    DIET:  We do recommend a small meal at first, but then you may proceed to your regular diet.  Drink plenty of fluids but you should avoid alcoholic beverages for 24 hours.  ACTIVITY:  You should plan to take it easy for the rest of today and you should NOT DRIVE or use heavy machinery until tomorrow (because of the sedation medicines used during the test).    FOLLOW UP: Our staff will call the number listed on your records the next business day following your procedure.  We will call around 7:15- 8:00 am to check on you and address any questions or concerns that you may have regarding the information given to you following your procedure. If we do not reach you, we will leave a message.     If any biopsies were taken you will be contacted by phone or by letter within the next 1-3 weeks.  Please call us at [phone] if you have not heard about the biopsies in 3 weeks.    SIGNATURES/CONFIDENTIALITY: You and/or your care partner have signed paperwork which will be entered into your electronic medical record.  These signatures attest to the fact that that the information above on your After Visit Summary has been reviewed and is understood.  Full responsibility of  the confidentiality of this discharge information lies with you and/or your care-partner. 

## 2023-01-16 NOTE — Telephone Encounter (Signed)
Patient called and stated that about 15 minutes after drinking her second bottle of prep she threw it all up.  She stated her last bowel movement was pretty clear.  I advised her to continue drinking all the water and if she isn't clear in an hour to call us back.

## 2023-01-16 NOTE — Progress Notes (Unsigned)
Called to room to assist during endoscopic procedure.  Patient ID and intended procedure confirmed with present staff. Received instructions for my participation in the procedure from the performing physician.  

## 2023-01-17 ENCOUNTER — Telehealth: Payer: Self-pay | Admitting: *Deleted

## 2023-01-17 ENCOUNTER — Encounter: Payer: Self-pay | Admitting: Gastroenterology

## 2023-01-17 NOTE — Telephone Encounter (Signed)
  Follow up Call-     01/16/2023    1:55 PM  Call back number  Post procedure Call Back phone  # [phone]  Permission to leave phone message Yes    [facility]

## 2023-01-27 ENCOUNTER — Encounter: Payer: Self-pay | Admitting: Gastroenterology

## 2023-03-10 ENCOUNTER — Other Ambulatory Visit: Payer: Self-pay | Admitting: Internal Medicine

## 2023-03-10 DIAGNOSIS — E1159 Type 2 diabetes mellitus with other circulatory complications: Secondary | ICD-10-CM

## 2023-03-10 DIAGNOSIS — I152 Hypertension secondary to endocrine disorders: Secondary | ICD-10-CM

## 2023-03-11 LAB — AMB RESULTS CONSOLE CBG: Glucose: 157

## 2023-03-11 NOTE — Progress Notes (Signed)
Please follow up on PCP needs. Pt has food needs and sent to [facility] for resources and food handout. No other SDOH needs at this time.

## 2023-03-27 ENCOUNTER — Ambulatory Visit: Payer: Medicare Other | Attending: Internal Medicine | Admitting: Internal Medicine

## 2023-03-27 ENCOUNTER — Other Ambulatory Visit: Payer: Self-pay

## 2023-03-27 ENCOUNTER — Encounter: Payer: Self-pay | Admitting: Internal Medicine

## 2023-03-27 VITALS — BP 137/86 | HR 90 | Temp 98.4°F | Ht 61.0 in | Wt 153.0 lb

## 2023-03-27 DIAGNOSIS — Z23 Encounter for immunization: Secondary | ICD-10-CM

## 2023-03-27 DIAGNOSIS — G43009 Migraine without aura, not intractable, without status migrainosus: Secondary | ICD-10-CM

## 2023-03-27 DIAGNOSIS — E1169 Type 2 diabetes mellitus with other specified complication: Secondary | ICD-10-CM

## 2023-03-27 DIAGNOSIS — E785 Hyperlipidemia, unspecified: Secondary | ICD-10-CM | POA: Diagnosis not present

## 2023-03-27 DIAGNOSIS — E1142 Type 2 diabetes mellitus with diabetic polyneuropathy: Secondary | ICD-10-CM | POA: Diagnosis not present

## 2023-03-27 DIAGNOSIS — I152 Hypertension secondary to endocrine disorders: Secondary | ICD-10-CM | POA: Diagnosis not present

## 2023-03-27 DIAGNOSIS — R682 Dry mouth, unspecified: Secondary | ICD-10-CM | POA: Diagnosis not present

## 2023-03-27 DIAGNOSIS — E1159 Type 2 diabetes mellitus with other circulatory complications: Secondary | ICD-10-CM | POA: Diagnosis not present

## 2023-03-27 DIAGNOSIS — Z7984 Long term (current) use of oral hypoglycemic drugs: Secondary | ICD-10-CM

## 2023-03-27 DIAGNOSIS — E119 Type 2 diabetes mellitus without complications: Secondary | ICD-10-CM

## 2023-03-27 LAB — POCT GLYCOSYLATED HEMOGLOBIN (HGB A1C): HbA1c, POC (controlled diabetic range): 6.1 % (ref 0.0–7.0)

## 2023-03-27 LAB — GLUCOSE, POCT (MANUAL RESULT ENTRY): POC Glucose: 109 mg/dl — AB (ref 70–99)

## 2023-03-27 MED ORDER — PROPRANOLOL HCL 20 MG PO TABS: 20.0000 mg | ORAL_TABLET | Freq: Two times a day (BID) | ORAL | 1 refills | Status: DC

## 2023-03-27 MED ORDER — ZOSTER VAC RECOMB ADJUVANTED 50 MCG/0.5ML IM SUSR
0.5000 mL | Freq: Once | INTRAMUSCULAR | 0 refills | Status: AC
Start: 2023-03-27 — End: 2023-03-28
  Filled 2023-03-27: qty 0.5, 1d supply, fill #0

## 2023-03-27 NOTE — Progress Notes (Signed)
Patient ID: Rebecca Glover, female    DOB: Aug 20, 1956  MRN: [id number]  CC: Diabetes (DM f/u. Ian Malkin on R leg. /Intermittent instances of dry throat / tongue - woke up from sleep X1 mo/Yes to shingles vax. Yes to Tdap vax. )   Subjective: Rebecca Glover is a 67 y.o. female who presents for chronic ds manager Her concerns today include:  Pt with hx of DM, HTN,  migraine (Dr. Delena Bali, CT with 5 mm Ca+ ? Meningioma)   HL:  Taking and tolerating Pravachol.  LDL was 73   HTN: Reports compliance with taking Norvasc 10 mg daily and Cozaar/HCTZ 100/25 mg daily. Took already for today.  Not limiting salt as much as she should  DM:   Results for orders placed or performed in visit on 03/27/23  POCT glucose (manual entry)  Result Value Ref Range   POC Glucose 109 (A) 70 - 99 mg/dl  POCT glycosylated hemoglobin (Hb A1C)  Result Value Ref Range   Hemoglobin A1C     HbA1c POC (<> result, manual entry)     HbA1c, POC (prediabetic range)     HbA1c, POC (controlled diabetic range) 6.1 0.0 - 7.0 %  Compliant with metformin 850 mg twice a day.  Tolerating med okay. Doing better with eating habits; less greasy foods; more fruits and veggies Planning to get back to the gym.    Chronic HA:  started on Propranolol on last visit because she was afraid to titrate Topamax. Reports good results with Propranolol with decrease frequency to 2x/wk   Saw ENT for sinuses.  No deviated septum.  He recommended a sleep study; ordered but pt deferred because she wants to get caught up with her bills. Then she will schedule.   Reports intermittent dry throat/tongue in middle of the night.  Occurred about 3 times over the past several mths.  Wonders if Protonix is cause; started at the time she started taking the med.   Sometimes she gets a warm sensation RT lower leg.  Can last few mins to few hrs for a few yrs.  Sometimes also on the other leg.  No numbness or tingling in the feet..   Patient Active Problem List    Diagnosis Date Noted   Type 2 diabetes mellitus with hyperlipidemia (HCC) 08/18/2022   Hypertension associated with diabetes (HCC) 08/18/2022   Chronic daily headache 08/18/2022   Chronic congestion of paranasal sinus 08/18/2022   HYPERTENSION, BENIGN SYSTEMIC 11/30/2006   IRRITABLE BOWEL SYNDROME 11/30/2006     Current Outpatient Medications on File Prior to Visit  Medication Sig Dispense Refill   amLODipine (NORVASC) 10 MG tablet TAKE 1 TABLET BY MOUTH EVERY DAY 90 tablet 0   Aspirin-Salicylamide-Caffeine (BC HEADACHE POWDER PO) Take 1 packet by mouth daily as needed (headaches).     fluticasone (FLONASE) 50 MCG/ACT nasal spray Place 2 sprays into both nostrils daily as needed for allergies or rhinitis. 16 g 1   hydroxypropyl methylcellulose / hypromellose (ISOPTO TEARS / GONIOVISC) 2.5 % ophthalmic solution 1 drop.     loratadine (CLARITIN) 10 MG tablet TAKE 1 TABLET BY MOUTH EVERY DAY 90 tablet 0   losartan-hydrochlorothiazide (HYZAAR) 100-25 MG tablet Take 1 tablet by mouth daily. 30 tablet 6   metFORMIN (GLUCOPHAGE) 850 MG tablet Take 1 tablet (850 mg total) by mouth 2 (two) times daily with a meal. 180 tablet 1   oxymetazoline (AFRIN) 0.05 % nasal spray Place 1 spray into both nostrils 2 (two)  times daily.     pantoprazole (PROTONIX) 40 MG tablet Take 1 tablet (40 mg total) by mouth 2 (two) times daily. 90 tablet 3   pravastatin (PRAVACHOL) 40 MG tablet Take 1 tablet (40 mg total) by mouth daily. 30 tablet 6   rizatriptan (MAXALT) 10 MG tablet Take 1 tablet (10 mg total) by mouth as needed for migraine. May repeat in 2 hours if needed. Max dose 2 pills in 24 hours 10 tablet 6   Current Facility-Administered Medications on File Prior to Visit  Medication Dose Route Frequency Provider Last Rate Last Admin   0.9 %  sodium chloride infusion  500 mL Intravenous Once Nandigam, Eleonore Chiquito, MD        Allergies  Allergen Reactions   Codeine Nausea Only    Social History    Socioeconomic History   Marital status: Single    Spouse name: Not on file   Number of children: Not on file   Years of education: Not on file   Highest education level: GED or equivalent  Occupational History   Not on file  Tobacco Use   Smoking status: Former    Packs/day: 0.25    Years: 5.00    Additional pack years: 0.00    Total pack years: 1.25    Types: Cigarettes   Smokeless tobacco: Never  Vaping Use   Vaping Use: Never used  Substance and Sexual Activity   Alcohol use: No   Drug use: No   Sexual activity: Not Currently    Birth control/protection: None  Other Topics Concern   Not on file  Social History Narrative   Not on file   Social Determinants of Health   Financial Resource Strain: Low Risk  (03/26/2023)   Overall Financial Resource Strain (CARDIA)    Difficulty of Paying Living Expenses: Not very hard  Food Insecurity: No Food Insecurity (03/26/2023)   Hunger Vital Sign    Worried About Running Out of Food in the Last Year: Never true    Ran Out of Food in the Last Year: Never true  Recent Concern: Food Insecurity - Food Insecurity Present (03/11/2023)   Hunger Vital Sign    Worried About Running Out of Food in the Last Year: Sometimes true    Ran Out of Food in the Last Year: Sometimes true  Transportation Needs: No Transportation Needs (03/26/2023)   PRAPARE - Administrator, Civil Service (Medical): No    Lack of Transportation (Non-Medical): No  Physical Activity: Insufficiently Active (03/26/2023)   Exercise Vital Sign    Days of Exercise per Week: 1 day    Minutes of Exercise per Session: 20 min  Stress: No Stress Concern Present (03/26/2023)   [employer] of Occupational Health - Occupational Stress Questionnaire    Feeling of Stress : Only a little  Social Connections: Unknown (03/26/2023)   Social Connection and Isolation Panel [NHANES]    Frequency of Communication with Friends and Family: More than three times a week     Frequency of Social Gatherings with Friends and Family: More than three times a week    Attends Religious Services: More than 4 times per year    Active Member of [employer] or Organizations: Not on file    Attends Banker Meetings: Not on file    Marital Status: Never married  Intimate Partner Violence: Not At Risk (03/11/2023)   Humiliation, Afraid, Rape, and Kick questionnaire    Fear of Current or  Ex-Partner: No    Emotionally Abused: No    Physically Abused: No    Sexually Abused: No    Family History  Problem Relation Age of Onset   Diabetes Mother    Breast cancer Sister 61   Stroke Sister    Arthritis Maternal Aunt    Colon cancer Neg Hx     Past Surgical History:  Procedure Laterality Date   ABDOMINAL HYSTERECTOMY  10/03/1980   APPENDECTOMY  10/03/1962   COLONOSCOPY  12/01/2015   polyps   COLONOSCOPY WITH ESOPHAGOGASTRODUODENOSCOPY (EGD)  01/16/2023   UPPER GASTROINTESTINAL ENDOSCOPY  10/04/2003   UPPER GASTROINTESTINAL ENDOSCOPY  10/03/1997   upper gi series    ROS: Review of Systems Negative except as stated above  PHYSICAL EXAM: BP 137/86 (BP Location: Left Arm, Patient Position: Sitting, Cuff Size: Normal)   Pulse 90   Temp 98.4 F (36.9 C) (Oral)   Ht 5\' 1"  (1.549 m)   Wt 153 lb (69.4 kg)   SpO2 96%   BMI 28.91 kg/m   Physical Exam Repeat blood pressure 140/90 General appearance - alert, well appearing, older African-American female and in no distress Mental status - normal mood, behavior, speech, dress, motor activity, and thought processes Neck - supple, no significant adenopathy Chest - clear to auscultation, no wheezes, rales or rhonchi, symmetric air entry Heart - normal rate, regular rhythm, normal S1, S2, no murmurs, rubs, clicks or gallops Extremities - peripheral pulses normal, no pedal edema, no clubbing or cyanosis Diabetic Foot Exam - Simple   Simple Foot Form Diabetic Foot exam was performed with the following findings: Yes  03/27/2023  5:03 PM  Visual Inspection No deformities, no ulcerations, no other skin breakdown bilaterally: Yes Sensation Testing Intact to touch and monofilament testing bilaterally: Yes Pulse Check Posterior Tibialis and Dorsalis pulse intact bilaterally: Yes Comments         Latest Ref Rng & Units 08/23/2022    8:52 AM  CMP  Glucose 70 - 99 mg/dL 161   BUN 8 - 27 mg/dL 14   Creatinine 0.96 - 1.00 mg/dL 0.45   Sodium 409 - 811 mmol/L 141   Potassium 3.5 - 5.2 mmol/L 3.8   Chloride 96 - 106 mmol/L 99   CO2 20 - 29 mmol/L 21   Calcium 8.7 - 10.3 mg/dL 9.9   Total Protein 6.0 - 8.5 g/dL 7.6   Total Bilirubin 0.0 - 1.2 mg/dL 0.3   Alkaline Phos 44 - 121 IU/L 114   AST 0 - 40 IU/L 34   ALT 0 - 32 IU/L 25    Lipid Panel     Component Value Date/Time   CHOL 145 08/23/2022 0852   TRIG 115 08/23/2022 0852   HDL 51 08/23/2022 0852   CHOLHDL 2.8 08/23/2022 0852   LDLCALC 73 08/23/2022 0852    CBC    Component Value Date/Time   WBC 8.3 08/23/2022 0852   RBC 5.44 (H) 08/23/2022 0852   HGB 15.1 08/23/2022 0852   HCT 45.3 08/23/2022 0852   PLT 354 08/23/2022 0852   MCV 83 08/23/2022 0852   MCH 27.8 08/23/2022 0852   MCHC 33.3 08/23/2022 0852   RDW 13.3 08/23/2022 0852    ASSESSMENT AND PLAN:  1. Diabetes mellitus treated with oral medication (HCC) Controlled  on metformin. Encouraged her to continue trying to eat healthy. Discussed ways she can exercise at home like marching in place for 30 minutes while watching a program. - POCT glucose (manual  entry) - POCT glycosylated hemoglobin (Hb A1C)  2. Diabetic peripheral neuropathy associated with type 2 diabetes mellitus (HCC) Symptoms and legs most likely due to diabetic neuropathy.  Patient states it does not occur often enough to warrant medication at this time.  3. Hypertension associated with diabetes (HCC) Not at goal.  Continue Norvasc 10 mg daily and Cozaar/HCTZ 100/25 1 tablet daily.  Increase propranolol to  20 mg twice a day. - propranolol (INDERAL) 20 MG tablet; Take 1 tablet (20 mg total) by mouth 2 (two) times daily. Dose increase  Dispense: 180 tablet; Refill: 1  4. Hyperlipidemia associated with type 2 diabetes mellitus (HCC) LDL at goal.  Continue pravastatin 40 mg  5. Migraine without aura and without status migrainosus, not intractable Improved with decreased frequency on propranolol.  Dose increased to 20 mg twice a day - propranolol (INDERAL) 20 MG tablet; Take 1 tablet (20 mg total) by mouth 2 (two) times daily. Dose increase  Dispense: 180 tablet; Refill: 1  6. Need for shingles vaccine Printed prescription given for her to take to the pharmacy to get her second Shingrix vaccine. - Zoster Vaccine Adjuvanted [facility]) injection; Inject 0.5 mLs into the muscle once for 1 dose.  Dispense: 0.5 mL; Refill: 0  7. Dry mouth Pantoprazole can cause a dry mouth in about 2% of the cases.  Patient states this does not happen often enough that she would want to stop or change the propranolol.    Patient was given the opportunity to ask questions.  Patient verbalized understanding of the plan and was able to repeat key elements of the plan.   This documentation was completed using Paediatric nurse.  Any transcriptional errors are unintentional.  Orders Placed This Encounter  Procedures   POCT glucose (manual entry)   POCT glycosylated hemoglobin (Hb A1C)     Requested Prescriptions   Signed Prescriptions Disp Refills   propranolol (INDERAL) 20 MG tablet 180 tablet 1    Sig: Take 1 tablet (20 mg total) by mouth 2 (two) times daily. Dose increase   Zoster Vaccine Adjuvanted [facility]) injection 0.5 mL 0    Sig: Inject 0.5 mLs into the muscle once for 1 dose.    Return in about 4 months (around 07/27/2023).  Jonah Blue, MD, FACP

## 2023-03-30 NOTE — Progress Notes (Deleted)
No chief complaint on file.   HISTORY OF PRESENT ILLNESS:  03/30/23 ALL:  Rebecca Glover is a 67 y.o. female here today for follow up for migraines. She was seen in consult with Dr Delena Bali 09/2022. CT unremarkable with exception of incidental finding of calcification. She was started on topiramate and titrated to 100mg  daily. Rizatriptan started for abortive therapy.   Since,    HISTORY (copied from Dr Quentin Mulling previous note)  The patient presents for evaluation of daily headaches which began several years ago. Initially saw ENT who thought these were related to her deviated septum. She was recommended treatment for the deviated septum but her PCP told her headaches were likely allergy related, so she never had anything done.   Headaches occur every morning when she wakes up. They are typically frontal and are associated with photophobia, phonophobia, and nausea. She takes BC powder every day up to 3 times a day for her headaches. This relieves her headache temporarily but the pain always returns.   Headache History: Onset: several years ago Triggers: wakes up with them Aura: none Location: frontal, sometimes occipital Associated Symptoms:             Photophobia: yes             Phonophobia: yes             Nausea: has nausea at baseline due to constipation Worse with activity?: yes Duration of headaches: 30 minutes-1 hour with medication   Headache days per month: 30 Headache free days per month: 0   Current Treatment: Abortive BC powder   Preventative none   Prior Therapies                                 Tylenol BC Powder Losartan  Lisinopril 10 mg daily   REVIEW OF SYSTEMS: Out of a complete 14 system review of symptoms, the patient complains only of the following symptoms, and all other reviewed systems are negative.   ALLERGIES: Allergies  Allergen Reactions   Codeine Nausea Only     HOME MEDICATIONS: Outpatient Medications Prior to Visit  Medication  Sig Dispense Refill   amLODipine (NORVASC) 10 MG tablet TAKE 1 TABLET BY MOUTH EVERY DAY 90 tablet 0   Aspirin-Salicylamide-Caffeine (BC HEADACHE POWDER PO) Take 1 packet by mouth daily as needed (headaches).     fluticasone (FLONASE) 50 MCG/ACT nasal spray Place 2 sprays into both nostrils daily as needed for allergies or rhinitis. 16 g 1   hydroxypropyl methylcellulose / hypromellose (ISOPTO TEARS / GONIOVISC) 2.5 % ophthalmic solution 1 drop.     loratadine (CLARITIN) 10 MG tablet TAKE 1 TABLET BY MOUTH EVERY DAY 90 tablet 0   losartan-hydrochlorothiazide (HYZAAR) 100-25 MG tablet Take 1 tablet by mouth daily. 30 tablet 6   metFORMIN (GLUCOPHAGE) 850 MG tablet Take 1 tablet (850 mg total) by mouth 2 (two) times daily with a meal. 180 tablet 1   oxymetazoline (AFRIN) 0.05 % nasal spray Place 1 spray into both nostrils 2 (two) times daily.     pantoprazole (PROTONIX) 40 MG tablet Take 1 tablet (40 mg total) by mouth 2 (two) times daily. 90 tablet 3   pravastatin (PRAVACHOL) 40 MG tablet Take 1 tablet (40 mg total) by mouth daily. 30 tablet 6   propranolol (INDERAL) 20 MG tablet Take 1 tablet (20 mg total) by mouth 2 (two) times daily. Dose increase  180 tablet 1   rizatriptan (MAXALT) 10 MG tablet Take 1 tablet (10 mg total) by mouth as needed for migraine. May repeat in 2 hours if needed. 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     PAST SURGICAL HISTORY: Past Surgical History:  Procedure Laterality Date   ABDOMINAL HYSTERECTOMY  10/03/1980   APPENDECTOMY   10/03/1962   COLONOSCOPY  12/01/2015   polyps   COLONOSCOPY WITH ESOPHAGOGASTRODUODENOSCOPY (EGD)  01/16/2023   UPPER GASTROINTESTINAL ENDOSCOPY  10/04/2003   UPPER GASTROINTESTINAL ENDOSCOPY  10/03/1997   upper gi series     FAMILY HISTORY: Family History  Problem Relation Age of Onset   Diabetes Mother    Breast cancer Sister 75   Stroke Sister    Arthritis Maternal Aunt    Colon cancer Neg Hx      SOCIAL HISTORY: Social History   Socioeconomic History   Marital status: Single    Spouse name: Not on file   Number of children: Not on file   Years of education: Not on file   Highest education level: GED or equivalent  Occupational History   Not on file  Tobacco Use   Smoking status: Former    Packs/day: 0.25    Years: 5.00    Additional pack years: 0.00    Total pack years: 1.25    Types: Cigarettes   Smokeless tobacco: Never  Vaping Use   Vaping Use: Never used  Substance and Sexual Activity   Alcohol use: No   Drug use: No   Sexual activity: Not Currently    Birth control/protection: None  Other Topics Concern   Not on file  Social History Narrative   Not on file   Social Determinants of Health   Financial Resource Strain: Low Risk  (03/26/2023)   Overall Financial Resource Strain (CARDIA)    Difficulty of Paying Living Expenses: Not very hard  Food Insecurity: No Food Insecurity (03/26/2023)   Hunger Vital Sign    Worried About Running Out of Food in the Last Year: Never true    Ran Out of Food in the Last Year: Never true  Recent Concern: Food Insecurity - Food Insecurity Present (03/11/2023)   Hunger Vital Sign    Worried About Running Out of Food in the Last Year: Sometimes true    Ran Out of Food in the Last Year: Sometimes true  Transportation Needs: No Transportation Needs (03/26/2023)   PRAPARE - Administrator, Civil Service (Medical): No    Lack of Transportation (Non-Medical): No  Physical Activity: Insufficiently Active  (03/26/2023)   Exercise Vital Sign    Days of Exercise per Week: 1 day    Minutes of Exercise per Session: 20 min  Stress: No Stress Concern Present (03/26/2023)   [employer] of Occupational Health - Occupational Stress Questionnaire    Feeling of Stress : Only a little  Social Connections: Unknown (03/26/2023)   Social Connection and Isolation Panel [NHANES]  Frequency of Communication with Friends and Family: More than three times a week    Frequency of Social Gatherings with Friends and Family: More than three times a week    Attends Religious Services: More than 4 times per year    Active Member of [employer] or Organizations: Not on file    Attends Banker Meetings: Not on file    Marital Status: Never married  Intimate Partner Violence: Not At Risk (03/11/2023)   Humiliation, Afraid, Rape, and Kick questionnaire    Fear of Current or Ex-Partner: No    Emotionally Abused: No    Physically Abused: No    Sexually Abused: No     PHYSICAL EXAM  There were no vitals filed for this visit. There is no height or weight on file to calculate BMI.  Generalized: Well developed, in no acute distress  Cardiology: normal rate and rhythm, no murmur auscultated  Respiratory: clear to auscultation bilaterally    Neurological examination  Mentation: Alert oriented to time, place, history taking. Follows all commands speech and language fluent Cranial nerve II-XII: Pupils were equal round reactive to light. Extraocular movements were full, visual field were full on confrontational test. Facial sensation and strength were normal. Uvula tongue midline. Head turning and shoulder shrug  were normal and symmetric. Motor: The motor testing reveals 5 over 5 strength of all 4 extremities. Good symmetric motor tone is noted throughout.  Sensory: Sensory testing is intact to soft touch on all 4 extremities. No evidence of extinction is noted.  Coordination: Cerebellar testing reveals good  finger-nose-finger and heel-to-shin bilaterally.  Gait and station: Gait is normal. Tandem gait is normal. Romberg is negative. No drift is seen.  Reflexes: Deep tendon reflexes are symmetric and normal bilaterally.    DIAGNOSTIC DATA (LABS, IMAGING, TESTING) - I reviewed patient records, labs, notes, testing and imaging myself where available.  Lab Results  Component Value Date   WBC 8.3 08/23/2022   HGB 15.1 08/23/2022   HCT 45.3 08/23/2022   MCV 83 08/23/2022   PLT 354 08/23/2022      Component Value Date/Time   NA 141 08/23/2022 0852   K 3.8 08/23/2022 0852   CL 99 08/23/2022 0852   CO2 21 08/23/2022 0852   GLUCOSE 102 (H) 08/23/2022 0852   BUN 14 08/23/2022 0852   CREATININE 0.76 08/23/2022 0852   CALCIUM 9.9 08/23/2022 0852   PROT 7.6 08/23/2022 0852   ALBUMIN 4.9 08/23/2022 0852   AST 34 08/23/2022 0852   ALT 25 08/23/2022 0852   ALKPHOS 114 08/23/2022 0852   BILITOT 0.3 08/23/2022 0852   Lab Results  Component Value Date   CHOL 145 08/23/2022   HDL 51 08/23/2022   LDLCALC 73 08/23/2022   TRIG 115 08/23/2022   CHOLHDL 2.8 08/23/2022   Lab Results  Component Value Date   HGBA1C 6.1 03/27/2023   No results found for: "VITAMINB12" No results found for: "TSH"      No data to display               No data to display           ASSESSMENT AND PLAN  67 y.o. year old female  has a past medical history of Allergy, Diabetes mellitus, GERD (gastroesophageal reflux disease), Hyperlipidemia, Hypertension, and Neuromuscular disorder (HCC) (10/03/2010). here with    No diagnosis found.  Ruel Favors ***.  Healthy lifestyle habits encouraged. *** will follow up with PCP as directed. ***  will return to see me in ***, sooner if needed. *** verbalizes understanding and agreement with this plan.   No orders of the defined types were placed in this encounter.    No orders of the defined types were placed in this encounter.    Shawnie Dapper, MSN, FNP-C  03/30/2023, 4:08 PM  [facility] [address] [place], [place] [address] [phone]

## 2023-04-02 ENCOUNTER — Other Ambulatory Visit: Payer: Self-pay | Admitting: Internal Medicine

## 2023-04-02 DIAGNOSIS — I152 Hypertension secondary to endocrine disorders: Secondary | ICD-10-CM

## 2023-04-04 NOTE — Telephone Encounter (Signed)
Requested Prescriptions  Pending Prescriptions Disp Refills   losartan-hydrochlorothiazide (HYZAAR) 100-25 MG tablet [Pharmacy Med Name: LOSARTAN-HCTZ 100-25 MG TAB] 90 tablet 1    Sig: TAKE 1 TABLET BY MOUTH EVERY DAY     Cardiovascular: ARB + Diuretic Combos Failed - 04/02/2023 12:08 AM      Failed - K in normal range and within 180 days    Potassium  Date Value Ref Range Status  08/23/2022 3.8 3.5 - 5.2 mmol/L Final         Failed - Na in normal range and within 180 days    Sodium  Date Value Ref Range Status  08/23/2022 141 134 - 144 mmol/L Final         Failed - Cr in normal range and within 180 days    Creatinine, Ser  Date Value Ref Range Status  08/23/2022 0.76 0.57 - 1.00 mg/dL Final         Failed - eGFR is 10 or above and within 180 days    eGFR  Date Value Ref Range Status  08/23/2022 86 >59 mL/min/1.73 Final         Passed - Patient is not pregnant      Passed - Last BP in normal range    BP Readings from Last 1 Encounters:  03/27/23 137/86         Passed - Valid encounter within last 6 months    Recent Outpatient Visits           1 week ago Diabetes mellitus treated with oral medication [facility])   [facility] Jonah Blue B, MD   4 months ago Hypertension associated with diabetes [facility])   [place] [facility] Marcine Matar, MD   7 months ago Encounter to establish care   [facility] Marcine Matar, MD       Future Appointments             In 3 months Marcine Matar, MD [facility]

## 2023-04-10 ENCOUNTER — Ambulatory Visit: Payer: Medicare Other | Admitting: Family Medicine

## 2023-04-10 DIAGNOSIS — G43709 Chronic migraine without aura, not intractable, without status migrainosus: Secondary | ICD-10-CM

## 2023-04-12 ENCOUNTER — Encounter: Payer: Self-pay | Admitting: *Deleted

## 2023-04-12 NOTE — Progress Notes (Signed)
Pt attended 03/11/23 screening event where her b/p was 133/88 and her blood sugar was 157. At the event, the pt reported food insecurities and was given resources, and pt did not identify a PCP. Chart review, however, indicates that pt has already established care with Dr. Jonah Blue at the [facility], with whom she had an appt on 03/27/23 and with whom she has a future appt on 07/27/2023. During her 03/27/23 office visit, pt's SDOH need assessment notes no ongoing food insecurities. No additional health equity team support indicated at this time.

## 2023-05-10 ENCOUNTER — Telehealth: Payer: Self-pay | Admitting: Family Medicine

## 2023-05-10 NOTE — Telephone Encounter (Signed)
LVM and sent mychart msg informing pt of need to reschedule 09/28/23 appt - NP out

## 2023-05-29 LAB — HM DIABETES EYE EXAM

## 2023-05-31 ENCOUNTER — Other Ambulatory Visit: Payer: Self-pay | Admitting: Internal Medicine

## 2023-05-31 DIAGNOSIS — E1159 Type 2 diabetes mellitus with other circulatory complications: Secondary | ICD-10-CM

## 2023-06-23 ENCOUNTER — Other Ambulatory Visit: Payer: Self-pay | Admitting: Internal Medicine

## 2023-06-23 DIAGNOSIS — Z1231 Encounter for screening mammogram for malignant neoplasm of breast: Secondary | ICD-10-CM

## 2023-07-08 ENCOUNTER — Other Ambulatory Visit: Payer: Self-pay | Admitting: Internal Medicine

## 2023-07-08 DIAGNOSIS — E1159 Type 2 diabetes mellitus with other circulatory complications: Secondary | ICD-10-CM

## 2023-07-10 NOTE — Telephone Encounter (Signed)
Request is too soon for refill. Last refill 04/04/23 for 90 and 1 refill.  Requested Prescriptions  Pending Prescriptions Disp Refills   losartan-hydrochlorothiazide (HYZAAR) 100-25 MG tablet [Pharmacy Med Name: LOSARTAN-HCTZ 100-25 MG TAB] 90 tablet 1    Sig: TAKE 1 TABLET BY MOUTH EVERY DAY     Cardiovascular: ARB + Diuretic Combos Failed - 07/08/2023  2:06 PM      Failed - K in normal range and within 180 days    Potassium  Date Value Ref Range Status  08/23/2022 3.8 3.5 - 5.2 mmol/L Final         Failed - Na in normal range and within 180 days    Sodium  Date Value Ref Range Status  08/23/2022 141 134 - 144 mmol/L Final         Failed - Cr in normal range and within 180 days    Creatinine, Ser  Date Value Ref Range Status  08/23/2022 0.76 0.57 - 1.00 mg/dL Final         Failed - eGFR is 10 or above and within 180 days    eGFR  Date Value Ref Range Status  08/23/2022 86 >59 mL/min/1.73 Final         Passed - Patient is not pregnant      Passed - Last BP in normal range    BP Readings from Last 1 Encounters:  03/27/23 137/86         Passed - Valid encounter within last 6 months    Recent Outpatient Visits           3 months ago Diabetes mellitus treated with oral medication [facility])   [facility] Jonah Blue B, MD   7 months ago Hypertension associated with diabetes [facility])   [facility] Marcine Matar, MD   10 months ago Encounter to establish care   [facility] Marcine Matar, MD       Future Appointments             In 2 weeks Marcine Matar, MD [facility] [place]

## 2023-07-19 ENCOUNTER — Ambulatory Visit
Admission: RE | Admit: 2023-07-19 | Discharge: 2023-07-19 | Disposition: A | Discharge status: Home / Self Care | Payer: Medicare Other | Source: Ambulatory Visit | Attending: Internal Medicine | Admitting: Internal Medicine

## 2023-07-19 DIAGNOSIS — Z1231 Encounter for screening mammogram for malignant neoplasm of breast: Secondary | ICD-10-CM | POA: Diagnosis not present

## 2023-07-27 ENCOUNTER — Encounter: Payer: Self-pay | Admitting: Internal Medicine

## 2023-07-27 ENCOUNTER — Ambulatory Visit: Payer: Medicare Other | Attending: Internal Medicine | Admitting: Internal Medicine

## 2023-07-27 VITALS — BP 129/88 | HR 73 | Temp 98.2°F | Ht 61.0 in | Wt 151.0 lb

## 2023-07-27 DIAGNOSIS — E785 Hyperlipidemia, unspecified: Secondary | ICD-10-CM

## 2023-07-27 DIAGNOSIS — E1169 Type 2 diabetes mellitus with other specified complication: Secondary | ICD-10-CM | POA: Diagnosis not present

## 2023-07-27 DIAGNOSIS — E1159 Type 2 diabetes mellitus with other circulatory complications: Secondary | ICD-10-CM | POA: Diagnosis not present

## 2023-07-27 DIAGNOSIS — Z23 Encounter for immunization: Secondary | ICD-10-CM

## 2023-07-27 DIAGNOSIS — Z7984 Long term (current) use of oral hypoglycemic drugs: Secondary | ICD-10-CM

## 2023-07-27 DIAGNOSIS — I152 Hypertension secondary to endocrine disorders: Secondary | ICD-10-CM | POA: Diagnosis not present

## 2023-07-27 DIAGNOSIS — E119 Type 2 diabetes mellitus without complications: Secondary | ICD-10-CM | POA: Diagnosis not present

## 2023-07-27 LAB — POCT GLYCOSYLATED HEMOGLOBIN (HGB A1C): HbA1c, POC (controlled diabetic range): 6.2 % (ref 0.0–7.0)

## 2023-07-27 LAB — GLUCOSE, POCT (MANUAL RESULT ENTRY): POC Glucose: 106 mg/dL — AB (ref 70–99)

## 2023-07-27 MED ORDER — AMLODIPINE BESYLATE 10 MG PO TABS: 10.0000 mg | ORAL_TABLET | Freq: Every day | ORAL | 0 refills | Status: DC

## 2023-07-27 NOTE — Progress Notes (Signed)
Patient ID: [name], female    DOB: 07-14-1956  MRN: [id number]  CC: Diabetes (DM & HTN f/u.  Med refills. /No questions / concerns/Yes to Tdap & flu vax.)   Subjective: Rebecca Glover is a 67 y.o. female who presents for chronic ds management. Her concerns today include:  Pt with hx of DM, HTN,  migraine (Dr. Delena Bali, CT with 5 mm Ca+ ? Meningioma)   HTN: Reports compliance with taking Norvasc 10 mg daily and Cozaar/HCTZ 100/25 mg daily and Propranolol 20 mg twice a day.  Took medicine Not checking BP but better with limiting salt.  She does have a blood pressure device at home. No CP/SOB  DM: Results for orders placed or performed in visit on 07/27/23  POCT glycosylated hemoglobin (Hb A1C)  Result Value Ref Range   Hemoglobin A1C     HbA1c POC (<> result, manual entry)     HbA1c, POC (prediabetic range)     HbA1c, POC (controlled diabetic range) 6.2 0.0 - 7.0 %  POCT glucose (manual entry)  Result Value Ref Range   POC Glucose 106 (A) 70 - 99 mg/dl  Compliant with metformin 850 mg twice a day.  Some bloating at times, seldom diarrhea. Not checking BS.   Still doing good with eating habits.  Has not made it back to gym as planned on last visit.  Can walk in her apartment complex and plans to start doing so Reports compliance with taking pravastatin 40 mg daily.  Last LDL was 73.   HM:  yes to flu shot and Tdapt Patient Active Problem List   Diagnosis Date Noted   Type 2 diabetes mellitus with hyperlipidemia (HCC) 08/18/2022   Hypertension associated with diabetes (HCC) 08/18/2022   Chronic daily headache 08/18/2022   Chronic congestion of paranasal sinus 08/18/2022   HYPERTENSION, BENIGN SYSTEMIC 11/30/2006   IRRITABLE BOWEL SYNDROME 11/30/2006     Current Outpatient Medications on File Prior to Visit  Medication Sig Dispense Refill   Aspirin-Salicylamide-Caffeine (BC HEADACHE POWDER PO) Take 1 packet by mouth daily as needed (headaches).     fluticasone (FLONASE) 50  MCG/ACT nasal spray Place 2 sprays into both nostrils daily as needed for allergies or rhinitis. 16 g 1   hydroxypropyl methylcellulose / hypromellose (ISOPTO TEARS / GONIOVISC) 2.5 % ophthalmic solution 1 drop.     losartan-hydrochlorothiazide (HYZAAR) 100-25 MG tablet TAKE 1 TABLET BY MOUTH EVERY DAY 90 tablet 1   metFORMIN (GLUCOPHAGE) 850 MG tablet Take 1 tablet (850 mg total) by mouth 2 (two) times daily with a meal. 180 tablet 1   pantoprazole (PROTONIX) 40 MG tablet Take 1 tablet (40 mg total) by mouth 2 (two) times daily. 90 tablet 3   pravastatin (PRAVACHOL) 40 MG tablet Take 1 tablet (40 mg total) by mouth daily. 30 tablet 6   propranolol (INDERAL) 20 MG tablet Take 1 tablet (20 mg total) by mouth 2 (two) times daily. Dose increase 180 tablet 1   oxymetazoline (AFRIN) 0.05 % nasal spray Place 1 spray into both nostrils 2 (two) times daily. (Patient not taking: Reported on 07/27/2023)     rizatriptan (MAXALT) 10 MG tablet Take 1 tablet (10 mg total) by mouth as needed for migraine. May repeat in 2 hours if needed. Max dose 2 pills in 24 hours (Patient not taking: Reported on 07/27/2023) 10 tablet 6   Current Facility-Administered Medications on File Prior to Visit  Medication Dose Route Frequency Provider Last Rate Last Admin  0.9 %  sodium chloride infusion  500 mL Intravenous Once Nandigam, Eleonore Chiquito, MD        Allergies  Allergen Reactions   Codeine Nausea Only    Social History   Socioeconomic History   Marital status: Single    Spouse name: Not on file   Number of children: Not on file   Years of education: Not on file   Highest education level: 10th grade  Occupational History   Not on file  Tobacco Use   Smoking status: Former    Current packs/day: 0.25    Average packs/day: 0.3 packs/day for 5.0 years (1.3 ttl pk-yrs)    Types: Cigarettes   Smokeless tobacco: Never  Vaping Use   Vaping status: Never Used  Substance and Sexual Activity   Alcohol use: No   Drug  use: No   Sexual activity: Not Currently    Birth control/protection: None  Other Topics Concern   Not on file  Social History Narrative   Not on file   Social Determinants of Health   Financial Resource Strain: Low Risk  (07/27/2023)   Overall Financial Resource Strain (CARDIA)    Difficulty of Paying Living Expenses: Not hard at all  Food Insecurity: No Food Insecurity (07/27/2023)   Hunger Vital Sign    Worried About Running Out of Food in the Last Year: Never true    Ran Out of Food in the Last Year: Never true  Recent Concern: Food Insecurity - Food Insecurity Present (07/24/2023)   Hunger Vital Sign    Worried About Running Out of Food in the Last Year: Sometimes true    Ran Out of Food in the Last Year: Never true  Transportation Needs: No Transportation Needs (07/27/2023)   PRAPARE - Administrator, Civil Service (Medical): No    Lack of Transportation (Non-Medical): No  Physical Activity: Insufficiently Active (07/27/2023)   Exercise Vital Sign    Days of Exercise per Week: 2 days    Minutes of Exercise per Session: 10 min  Stress: No Stress Concern Present (07/27/2023)   [employer] of Occupational Health - Occupational Stress Questionnaire    Feeling of Stress : Not at all  Social Connections: Moderately Integrated (07/27/2023)   Social Connection and Isolation Panel [NHANES]    Frequency of Communication with Friends and Family: More than three times a week    Frequency of Social Gatherings with Friends and Family: Twice a week    Attends Religious Services: More than 4 times per year    Active Member of Clubs or Organizations: Yes    Attends Banker Meetings: More than 4 times per year    Marital Status: Never married  Intimate Partner Violence: Not At Risk (07/27/2023)   Humiliation, Afraid, Rape, and Kick questionnaire    Fear of Current or Ex-Partner: No    Emotionally Abused: No    Physically Abused: No    Sexually Abused: No     Family History  Problem Relation Age of Onset   Diabetes Mother    Breast cancer Sister 10   Stroke Sister    Arthritis Maternal Aunt    Colon cancer Neg Hx     Past Surgical History:  Procedure Laterality Date   ABDOMINAL HYSTERECTOMY  10/03/1980   APPENDECTOMY  10/03/1962   COLONOSCOPY  12/01/2015   polyps   COLONOSCOPY WITH ESOPHAGOGASTRODUODENOSCOPY (EGD)  01/16/2023   UPPER GASTROINTESTINAL ENDOSCOPY  10/04/2003   UPPER GASTROINTESTINAL ENDOSCOPY  10/03/1997   upper gi series    ROS: Review of Systems Negative except as stated above  PHYSICAL EXAM: BP 129/88 (BP Location: Left Arm, Patient Position: Sitting, Cuff Size: Normal)   Pulse 73   Temp 98.2 F (36.8 C) (Oral)   Ht 5\' 1"  (1.549 m)   Wt 151 lb (68.5 kg)   SpO2 96%   BMI 28.53 kg/m   Physical Exam  General appearance - alert, well appearing, and in no distress Mental status - normal mood, behavior, speech, dress, motor activity, and thought processes Neck - supple, no significant adenopathy Chest - clear to auscultation, no wheezes, rales or rhonchi, symmetric air entry Heart - normal rate, regular rhythm, normal S1, S2, no murmurs, rubs, clicks or gallops Extremities - peripheral pulses normal, no pedal edema, no clubbing or cyanosis      Latest Ref Rng & Units 08/23/2022    8:52 AM  CMP  Glucose 70 - 99 mg/dL 132   BUN 8 - 27 mg/dL 14   Creatinine 4.40 - 1.00 mg/dL 1.02   Sodium 725 - 366 mmol/L 141   Potassium 3.5 - 5.2 mmol/L 3.8   Chloride 96 - 106 mmol/L 99   CO2 20 - 29 mmol/L 21   Calcium 8.7 - 10.3 mg/dL 9.9   Total Protein 6.0 - 8.5 g/dL 7.6   Total Bilirubin 0.0 - 1.2 mg/dL 0.3   Alkaline Phos 44 - 121 IU/L 114   AST 0 - 40 IU/L 34   ALT 0 - 32 IU/L 25    Lipid Panel     Component Value Date/Time   CHOL 145 08/23/2022 0852   TRIG 115 08/23/2022 0852   HDL 51 08/23/2022 0852   CHOLHDL 2.8 08/23/2022 0852   LDLCALC 73 08/23/2022 0852    CBC    Component Value  Date/Time   WBC 8.3 08/23/2022 0852   RBC 5.44 (H) 08/23/2022 0852   HGB 15.1 08/23/2022 0852   HCT 45.3 08/23/2022 0852   PLT 354 08/23/2022 0852   MCV 83 08/23/2022 0852   MCH 27.8 08/23/2022 0852   MCHC 33.3 08/23/2022 0852   RDW 13.3 08/23/2022 0852    ASSESSMENT AND PLAN: 1. Diabetes mellitus treated with oral medication (HCC) At goal Controlled on metformin.  Encouraged patient to start walking in her apartment complex 2 to 4 days a week for 30 minutes. - POCT glycosylated hemoglobin (Hb A1C) - POCT glucose (manual entry) - CBC - Comprehensive metabolic panel - Lipid panel - Microalbumin / creatinine urine ratio  2. Hypertension associated with diabetes (HCC) Not at goal. Encouraged her to check blood pressure at least twice a week and record readings with goal being 130/80 or lower.  Follow-up with clinical pharmacist in 1 month for recheck.  Continue amlodipine 10 mg daily, Cozaar/HCTZ 100 mg / 25 mg daily and propranolol 20 mg twice a day. - amLODipine (NORVASC) 10 MG tablet; Take 1 tablet (10 mg total) by mouth daily.  Dispense: 90 tablet; Refill: 0  3. Hyperlipidemia associated with type 2 diabetes mellitus (HCC) Continue pravastatin  4. Encounter for immunization - Flu Vaccine Trivalent High Dose (Fluad)  5. Need for Tdap vaccination Given today.   Patient was given the opportunity to ask questions.  Patient verbalized understanding of the plan and was able to repeat key elements of the plan.   This documentation was completed using Paediatric nurse.  Any transcriptional errors are unintentional.  Orders Placed This Encounter  Procedures   Flu Vaccine Trivalent High Dose (Fluad)   Tdap vaccine greater than or equal to 7yo IM   CBC   Comprehensive metabolic panel   Lipid panel   Microalbumin / creatinine urine ratio   POCT glycosylated hemoglobin (Hb A1C)   POCT glucose (manual entry)     Requested Prescriptions   Signed  Prescriptions Disp Refills   amLODipine (NORVASC) 10 MG tablet 90 tablet 0    Sig: Take 1 tablet (10 mg total) by mouth daily.    Return in about 4 months (around 11/27/2023) for Appt with [facility] in 4 wks for BP check.  Jonah Blue, MD, FACP

## 2023-07-29 LAB — MICROALBUMIN / CREATININE URINE RATIO
Creatinine, Urine: 62.5 mg/dL
Microalb/Creat Ratio: <5 mg/g{creat} (ref 0–29)
Microalbumin, Urine: <3 ug/mL

## 2023-07-29 LAB — CBC
Hematocrit: 45.6 % (ref 34.0–46.6)
Hemoglobin: 14.5 g/dL (ref 11.1–15.9)
MCH: 27.4 pg (ref 26.6–33.0)
MCHC: 31.8 g/dL (ref 31.5–35.7)
MCV: 86 fL (ref 79–97)
Platelets: 376 10*3/uL (ref 150–450)
RBC: 5.29 x10E6/uL — ABNORMAL HIGH (ref 3.77–5.28)
RDW: 13.3 % (ref 11.7–15.4)
WBC: 7 10*3/uL (ref 3.4–10.8)

## 2023-07-29 LAB — LIPID PANEL
Chol/HDL Ratio: 3.1 ratio (ref 0.0–4.4)
Cholesterol, Total: 150 mg/dL (ref 100–199)
HDL: 49 mg/dL (ref >39)
LDL Chol Calc (NIH): 80 mg/dL (ref 0–99)
Triglycerides: 117 mg/dL (ref 0–149)
VLDL Cholesterol Cal: 21 mg/dL (ref 5–40)

## 2023-07-29 LAB — COMPREHENSIVE METABOLIC PANEL
ALT: 26 [IU]/L (ref 0–32)
AST: 25 [IU]/L (ref 0–40)
Albumin: 4.7 g/dL (ref 3.9–4.9)
Alkaline Phosphatase: 105 [IU]/L (ref 44–121)
BUN/Creatinine Ratio: 20 (ref 12–28)
Bilirubin Total: 0.4 mg/dL (ref 0.0–1.2)
CO2: 22 mmol/L (ref 20–29)
Calcium: 10.2 mg/dL (ref 8.7–10.3)
Chloride: 99 mmol/L (ref 96–106)
Creatinine, Ser: 20 mg/dL (ref 12–28)
Globulin, Total: 2.8 g/dL (ref 1.5–4.5)
Glucose: 95 mg/dL (ref 70–99)
Potassium: 4 mmol/L (ref 3.5–5.2)
Sodium: 140 mmol/L (ref 134–144)
Total Protein: 7.5 g/dL (ref 6.0–8.5)
eGFR: 96 mL/min/{1.73_m2} (ref >59)
eGFR: 96 mg/dL (ref 8–27)

## 2023-08-17 ENCOUNTER — Other Ambulatory Visit: Payer: Self-pay | Admitting: Gastroenterology

## 2023-08-17 ENCOUNTER — Other Ambulatory Visit: Payer: Self-pay | Admitting: Internal Medicine

## 2023-08-17 DIAGNOSIS — E1169 Type 2 diabetes mellitus with other specified complication: Secondary | ICD-10-CM

## 2023-08-23 NOTE — Progress Notes (Unsigned)
S:     No chief complaint on file.  67 y.o. female who presents for hypertension evaluation, education, and management. PMH is significant for T2DM and HTN. Patient was referred and last seen by Primary Care Provider, Dr. Laural Benes, on 07/27/2023. At that visit BP was 129/88. Patient reported that she took her medications before that visit. Patient reported compliance with medications, limiting salt, but is not checking her BP at home. She does have a BP device at home. Denied SOB and chest pains.    Today, patient arrives in good spirits and presents without any assistance. Endorses headaches. Denies dizziness, blurred vision, swelling. Patient brought her BP device with her today. Patient shows me how she uses her device and she uses it upside down. I explained to her the correct way to use her BP device. Patient reports she checks her home BP an hour after she takes her BP medication. Patient reports adherence to her BP medications. I encouraged patient to continue limiting salt in diet, checking her BP at home and bringing it with her at her next visit.I encouraged patient to reschedule her appointment with her neurologist.  Patient reports hypertension longstanding.  Family/Social history:  -Diabetes (mother) -Cancer, stroke (sister)  Medication adherence appears optimal. Patient has taken BP medications today.   Current antihypertensives include: Amlodipine 10 mg daily, Losartan/HCTZ 100 mg / 25 mg daily, Propranolol 20 mg BID.   Antihypertensives tried in the past include: Propranolol 10 mg BID, Lisinopril 10 mg daily, Benazepril- hydrochlorothiazide 20/25 mg daily  Insurance coverage: [facility] : [facility] Medicare   Patient reported dietary habits: -Tries to limit salt but is still using seasoning with salt and likes to eat potato chips. I told patient about Mrs.Dash and also encouraged her to limit potato chips.   Patient-reported exercise habits: tries to walk daily   O:  BP reading from home  device:   Last 3 Office BP readings: BP Readings from Last 3 Encounters:  07/27/23 129/88  03/27/23 137/86  03/11/23 133/88    BMET    Component Value Date/Time   NA 140 07/27/2023 1225   K 4.0 07/27/2023 1225   CL 99 07/27/2023 1225   CO2 22 07/27/2023 1225   GLUCOSE 95 07/27/2023 1225   BUN 13 07/27/2023 1225   CREATININE 0.65 07/27/2023 1225   CALCIUM 10.2 07/27/2023 1225    Renal function: CrCl cannot be calculated (Patient's most recent lab result is older than the maximum 21 days allowed.).  Clinical ASCVD: No  The 10-year ASCVD risk score (Arnett DK, et al., 2019) is: 18.4%   Values used to calculate the score:     Age: 15 years     Sex: Female     Is Non-Hispanic African American: Yes     Diabetic: Yes     Tobacco smoker: No     Systolic Blood Pressure: 129 mmHg     Is BP treated: Yes     HDL Cholesterol: 49 mg/dL     Total Cholesterol: 150 mg/dL  A/P: Hypertension diagnosed currently uncontrolled on current medications. BP goal < 130/80 mmHg. BP today was 139/85, pulse: 83. Medication adherence appears optimal. Control is suboptimal due to medications not fully optimized. Propranolol will help the patient with her headaches and blood pressure. -Increased Propranolol 20 mg BID to 40 mg BID.  -Continued Amlodipine 10 mg daily.  -Continued Losartan/HCTZ 100 mg / 25 mg daily. -Patient educated on purpose, proper use, and potential adverse effects of medication.  -Counseled  on lifestyle modifications for blood pressure control including reduced dietary sodium, increased exercise, adequate sleep. -Encouraged patient to check BP at home and bring log of readings to next visit. Counseled on proper use of home BP cuff.   Results reviewed and written information provided.    Written patient instructions provided. Patient verbalized understanding of treatment plan.  Total time in face to face counseling 30 minutes.    Follow-up:  Pharmacist: 09/28/2023 . PCP  clinic visit: 11/27/2023.   Patient seen with: Erasmo Leventhal, PharmD Candidate  Class of 20[employer] SOP   Butch Penny, PharmD, [place], CPP Clinical Pharmacist [facility] [phone]

## 2023-08-24 ENCOUNTER — Encounter: Payer: Self-pay | Admitting: Pharmacist

## 2023-08-24 ENCOUNTER — Telehealth: Payer: Self-pay | Admitting: Internal Medicine

## 2023-08-24 ENCOUNTER — Ambulatory Visit: Payer: Medicare Other | Attending: Internal Medicine | Admitting: Pharmacist

## 2023-08-24 DIAGNOSIS — I152 Hypertension secondary to endocrine disorders: Secondary | ICD-10-CM

## 2023-08-24 DIAGNOSIS — E1159 Type 2 diabetes mellitus with other circulatory complications: Secondary | ICD-10-CM

## 2023-08-24 DIAGNOSIS — E785 Hyperlipidemia, unspecified: Secondary | ICD-10-CM

## 2023-08-24 DIAGNOSIS — E1169 Type 2 diabetes mellitus with other specified complication: Secondary | ICD-10-CM | POA: Diagnosis not present

## 2023-08-24 DIAGNOSIS — G43009 Migraine without aura, not intractable, without status migrainosus: Secondary | ICD-10-CM | POA: Diagnosis not present

## 2023-08-24 MED ORDER — PRAVASTATIN SODIUM 40 MG PO TABS: 40.0000 mg | ORAL_TABLET | Freq: Every day | ORAL | 1 refills | Status: DC

## 2023-08-24 MED ORDER — PANTOPRAZOLE SODIUM 40 MG PO TBEC: 40.0000 mg | DELAYED_RELEASE_TABLET | Freq: Two times a day (BID) | ORAL | 1 refills | Status: DC

## 2023-08-24 MED ORDER — LOSARTAN POTASSIUM-HCTZ 100-25 MG PO TABS: 1.0000 | ORAL_TABLET | Freq: Every day | ORAL | 1 refills | Status: DC

## 2023-08-24 MED ORDER — AMLODIPINE BESYLATE 10 MG PO TABS: 10.0000 mg | ORAL_TABLET | Freq: Every day | ORAL | 1 refills | Status: DC

## 2023-08-24 MED ORDER — PROPRANOLOL HCL 40 MG PO TABS: 40.0000 mg | ORAL_TABLET | Freq: Two times a day (BID) | ORAL | 1 refills | Status: DC

## 2023-08-24 MED ORDER — METFORMIN HCL 850 MG PO TABS: 850.0000 mg | ORAL_TABLET | Freq: Two times a day (BID) | ORAL | 1 refills | Status: DC

## 2023-08-24 NOTE — Telephone Encounter (Signed)
Pt stated when she came in to see [facility] today. There may have been some miscommunication in regards to medication with [facility] student, and I would like to speak with either of them to clarify. Discussing an increase in a medication. She stated she went and picked up her medication, and she thought the medication that was going to be increased was propranolol (INDERAL) 40 MG tablet; however, what was increased was pantoprazole (PROTONIX) 40 MG tablet. Pt stated she may just be confused as the medication names are so close.  Pt is requesting a callback. Please advise.

## 2023-08-24 NOTE — Telephone Encounter (Signed)
Patient received 3/6 prescriptions.  Pantoprazole, amlodipine, and Metformin from the pharmacy today. She has not be given Propanolol today.   Advised to speak to pharmacy about the other 3 prescriptions and could f/u if she has additional questions.

## 2023-08-29 NOTE — Telephone Encounter (Addendum)
Since patient has picked up 4/6 prescriptions . Has no additional concerns. States pharmacy will have other 2 ready and she will go back to pick up.

## 2023-09-28 ENCOUNTER — Ambulatory Visit: Payer: Medicare Other | Admitting: Family Medicine

## 2023-09-28 ENCOUNTER — Encounter: Payer: Self-pay | Admitting: Pharmacist

## 2023-09-28 ENCOUNTER — Ambulatory Visit: Payer: Medicare Other | Attending: Internal Medicine | Admitting: Pharmacist

## 2023-09-28 VITALS — BP 114/78 | HR 72

## 2023-09-28 DIAGNOSIS — Z7984 Long term (current) use of oral hypoglycemic drugs: Secondary | ICD-10-CM

## 2023-09-28 DIAGNOSIS — E1159 Type 2 diabetes mellitus with other circulatory complications: Secondary | ICD-10-CM | POA: Diagnosis not present

## 2023-09-28 DIAGNOSIS — I152 Hypertension secondary to endocrine disorders: Secondary | ICD-10-CM | POA: Diagnosis not present

## 2023-09-28 NOTE — Progress Notes (Signed)
S:     No chief complaint on file.  67 y.o. female who presents for hypertension evaluation, education, and management. PMH is significant for T2DM and HTN. Patient was referred and last seen by Primary Care Provider, Dr. Laural Benes, on 07/27/2023. At that visit BP was 129/88. Pharmacy last saw her on 08/24/2023 and increased her propranolol dose. BP was 139/85 mmHg and she was still endorsing daily HA.   Today, patient arrives in good spirits and presents without any assistance. She notes an improvement in her home BP readings, and I have listed these below. Still endorses headaches - these improved the first week, maybe two, of the increased propranolol dose but have since worsened to the level before we increased the dose. She believes there is a sinus component and does endorse frontal sinus pressure and a sensation of fullness in her nose. She sees a Insurance account manager for [employer]. Denies dizziness, blurred vision, swelling.   Patient reports hypertension longstanding.  Family/Social history:  -Diabetes (mother) -Cancer, stroke (sister)  Medication adherence appears optimal. Patient has taken BP medications today.   Current antihypertensives include: Amlodipine 10 mg daily, Losartan/HCTZ 100 mg / 25 mg daily (of note, last fill was a 90-ds on 06/30/2023), Propranolol 40 mg BID.   Antihypertensives tried in the past include: Propranolol 20 mg BID, Lisinopril 10 mg daily, Benazepril- hydrochlorothiazide 20/25 mg daily  Insurance coverage: [facility] Medicare   Patient reported dietary habits: -Tries to limit salt but is still using seasoning with salt and likes to eat potato chips. I told patient about Mrs.Dash and also encouraged her to limit potato chips.   Patient-reported exercise habits: tries to walk daily   O:   Vitals:   09/28/23 0856  BP: 114/78  Pulse: 72    BP reading from home device: Reviewed her device since last time.  SBP ranges in the 120s-130s since last visit.  SBP  with drastic improvement. Was in the 90s consistently before our visit with her last month. Now consistently ranges in the 70s.   Last 3 Office BP readings: BP Readings from Last 3 Encounters:  09/28/23 114/78  08/24/23 139/85  07/27/23 129/88    BMET    Component Value Date/Time   NA 140 07/27/2023 1225   K 4.0 07/27/2023 1225   CL 99 07/27/2023 1225   CO2 22 07/27/2023 1225   GLUCOSE 95 07/27/2023 1225   BUN 13 07/27/2023 1225   CREATININE 0.65 07/27/2023 1225   CALCIUM 10.2 07/27/2023 1225    Renal function: CrCl cannot be calculated (Patient's most recent lab result is older than the maximum 21 days allowed.).  Clinical ASCVD: No  The 10-year ASCVD risk score (Arnett DK, et al., 2019) is: 14.3%   Values used to calculate the score:     Age: 82 years     Sex: Female     Is Non-Hispanic African American: Yes     Diabetic: Yes     Tobacco smoker: No     Systolic Blood Pressure: 114 mmHg     Is BP treated: Yes     HDL Cholesterol: 49 mg/dL     Total Cholesterol: 150 mg/dL  A/P: Hypertension longstanding currently at goal on current medications. BP goal < 130/80 mmHg. Medication adherence appears optimal.  -Continued Propranolol 40 mg BID.  -Continued Amlodipine 10 mg daily.  -Continued Losartan/HCTZ 100 mg / 25 mg daily. -Patient educated on purpose, proper use, and potential adverse effects of medication.  -Counseled on  lifestyle modifications for blood pressure control including reduced dietary sodium, increased exercise, adequate sleep. -Encouraged patient to check BP at home and bring log of readings to next visit. Counseled on proper use of home BP cuff.   Results reviewed and written information provided.    Written patient instructions provided. Patient verbalized understanding of treatment plan.  Total time in face to face counseling 30 minutes.    Follow-up:  PCP clinic visit: 11/27/2023.   Butch Penny, PharmD, [place], CPP Clinical  Pharmacist [facility] [phone]

## 2023-11-27 ENCOUNTER — Encounter: Payer: Self-pay | Admitting: Internal Medicine

## 2023-11-27 ENCOUNTER — Ambulatory Visit: Payer: Medicare (Managed Care) | Attending: Internal Medicine | Admitting: Internal Medicine

## 2023-11-27 VITALS — BP 114/71 | HR 69 | Temp 98.7°F | Ht 61.0 in | Wt 158.0 lb

## 2023-11-27 DIAGNOSIS — E119 Type 2 diabetes mellitus without complications: Secondary | ICD-10-CM

## 2023-11-27 DIAGNOSIS — I10 Essential (primary) hypertension: Secondary | ICD-10-CM

## 2023-11-27 DIAGNOSIS — Z7984 Long term (current) use of oral hypoglycemic drugs: Secondary | ICD-10-CM | POA: Diagnosis not present

## 2023-11-27 DIAGNOSIS — M79642 Pain in left hand: Secondary | ICD-10-CM

## 2023-11-27 DIAGNOSIS — E1169 Type 2 diabetes mellitus with other specified complication: Secondary | ICD-10-CM

## 2023-11-27 DIAGNOSIS — E785 Hyperlipidemia, unspecified: Secondary | ICD-10-CM | POA: Diagnosis not present

## 2023-11-27 DIAGNOSIS — I152 Hypertension secondary to endocrine disorders: Secondary | ICD-10-CM

## 2023-11-27 LAB — POCT GLYCOSYLATED HEMOGLOBIN (HGB A1C): HbA1c, POC (controlled diabetic range): 6.2 % (ref 0.0–7.0)

## 2023-11-27 LAB — GLUCOSE, POCT (MANUAL RESULT ENTRY): POC Glucose: 87 mg/dL (ref 70–99)

## 2023-11-27 NOTE — Progress Notes (Signed)
 Patient ID: Rebecca Glover, female    DOB: 1955-12-04  MRN: [id number]  CC: Diabetes (DM f/u. Liliane Bade home BP machine/Intermittent pain on L hand - 1 week/Already received flu vax)   Subjective: Rebecca Glover is a 68 y.o. female who presents for chronic ds management. Her concerns today include:  Pt with hx of DM, HTN, migraine (Dr. Delena Bali, CT with 5 mm Ca+ ? Meningioma)   DM: Results for orders placed or performed in visit on 11/27/23  POCT glucose (manual entry)   Collection Time: 11/27/23 10:38 AM  Result Value Ref Range   POC Glucose 87 70 - 99 mg/dl  POCT glycosylated hemoglobin (Hb A1C)   Collection Time: 11/27/23 11:27 AM  Result Value Ref Range   Hemoglobin A1C     HbA1c POC (<> result, manual entry)     HbA1c, POC (prediabetic range)     HbA1c, POC (controlled diabetic range) 6.2 0.0 - 7.0 %  Compliant with metformin 850 mg twice a day.  Not checking BS. Eating 2 meals a day and limits portions. Limits fried foods to once a wk. Eating fresh fruits and veggies No getting in as much exercise as she should. "I need some motivation on that." Has invited friends but they will not commit. Has [employer] and gym closer to her house so she plans to start going. Reports compliance with Pravastatin.  HTN: taking Norvasc 10 mg and Coz/hydrochlorothiazide 100/25 mg daily and Propranolol 40 mg BID. Checking BP once a day.  Has her device with her. Recent readings: 107/66, 125/79, 128/80  Reports intermittent pain in the palm of the left hand.  She points to the area over the thenar eminence.  She does get pain in the thumb sometimes.  Pain in the palm and thumb pain not present today.  She does not use anything for this. Patient Active Problem List   Diagnosis Date Noted   Type 2 diabetes mellitus with hyperlipidemia (HCC) 08/18/2022   Hypertension associated with diabetes (HCC) 08/18/2022   Chronic daily headache 08/18/2022   Chronic congestion of paranasal sinus 08/18/2022    HYPERTENSION, BENIGN SYSTEMIC 11/30/2006   IRRITABLE BOWEL SYNDROME 11/30/2006     Current Outpatient Medications on File Prior to Visit  Medication Sig Dispense Refill   amLODipine (NORVASC) 10 MG tablet Take 1 tablet (10 mg total) by mouth daily. 90 tablet 1   Aspirin-Salicylamide-Caffeine (BC HEADACHE POWDER PO) Take 1 packet by mouth daily as needed (headaches).     fluticasone (FLONASE) 50 MCG/ACT nasal spray Place 2 sprays into both nostrils daily as needed for allergies or rhinitis. 16 g 1   losartan-hydrochlorothiazide (HYZAAR) 100-25 MG tablet Take 1 tablet by mouth daily. 90 tablet 1   metFORMIN (GLUCOPHAGE) 850 MG tablet Take 1 tablet (850 mg total) by mouth 2 (two) times daily with a meal. 180 tablet 1   pantoprazole (PROTONIX) 40 MG tablet Take 1 tablet (40 mg total) by mouth 2 (two) times daily. 180 tablet 1   pravastatin (PRAVACHOL) 40 MG tablet Take 1 tablet (40 mg total) by mouth daily. 90 tablet 1   propranolol (INDERAL) 40 MG tablet Take 1 tablet (40 mg total) by mouth 2 (two) times daily. 180 tablet 1   hydroxypropyl methylcellulose / hypromellose (ISOPTO TEARS / GONIOVISC) 2.5 % ophthalmic solution 1 drop. (Patient not taking: Reported on 11/27/2023)     oxymetazoline (AFRIN) 0.05 % nasal spray Place 1 spray into both nostrils 2 (two) times daily. (Patient  not taking: Reported on 11/27/2023)     rizatriptan (MAXALT) 10 MG tablet Take 1 tablet (10 mg total) by mouth as needed for migraine. May repeat in 2 hours if needed. Max dose 2 pills in 24 hours (Patient not taking: Reported on 11/27/2023) 10 tablet 6   Current Facility-Administered Medications on File Prior to Visit  Medication Dose Route Frequency Provider Last Rate Last Admin   0.9 %  sodium chloride infusion  500 mL Intravenous Once Nandigam, Eleonore Chiquito, MD        Allergies  Allergen Reactions   Codeine Nausea Only    Social History   Socioeconomic History   Marital status: Single    Spouse name: Not on file    Number of children: Not on file   Years of education: Not on file   Highest education level: 10th grade  Occupational History   Not on file  Tobacco Use   Smoking status: Former    Current packs/day: 0.25    Average packs/day: 0.3 packs/day for 5.0 years (1.3 ttl pk-yrs)    Types: Cigarettes   Smokeless tobacco: Never  Vaping Use   Vaping status: Never Used  Substance and Sexual Activity   Alcohol use: No   Drug use: No   Sexual activity: Not Currently    Birth control/protection: None  Other Topics Concern   Not on file  Social History Narrative   Not on file   Social Drivers of Health   Financial Resource Strain: Low Risk  (07/27/2023)   Overall Financial Resource Strain (CARDIA)    Difficulty of Paying Living Expenses: Not hard at all  Food Insecurity: No Food Insecurity (07/27/2023)   Hunger Vital Sign    Worried About Running Out of Food in the Last Year: Never true    Ran Out of Food in the Last Year: Never true  Recent Concern: Food Insecurity - Food Insecurity Present (07/24/2023)   Hunger Vital Sign    Worried About Running Out of Food in the Last Year: Sometimes true    Ran Out of Food in the Last Year: Never true  Transportation Needs: No Transportation Needs (07/27/2023)   PRAPARE - Administrator, Civil Service (Medical): No    Lack of Transportation (Non-Medical): No  Physical Activity: Insufficiently Active (07/27/2023)   Exercise Vital Sign    Days of Exercise per Week: 2 days    Minutes of Exercise per Session: 10 min  Stress: No Stress Concern Present (07/27/2023)   [employer] of Occupational Health - Occupational Stress Questionnaire    Feeling of Stress : Not at all  Social Connections: Moderately Integrated (07/27/2023)   Social Connection and Isolation Panel [NHANES]    Frequency of Communication with Friends and Family: More than three times a week    Frequency of Social Gatherings with Friends and Family: Twice a week     Attends Religious Services: More than 4 times per year    Active Member of [employer] or Organizations: Yes    Attends Banker Meetings: More than 4 times per year    Marital Status: Never married  Intimate Partner Violence: Not At Risk (07/27/2023)   Humiliation, Afraid, Rape, and Kick questionnaire    Fear of Current or Ex-Partner: No    Emotionally Abused: No    Physically Abused: No    Sexually Abused: No    Family History  Problem Relation Age of Onset   Diabetes Mother  Breast cancer Sister 43   Stroke Sister    Arthritis Maternal Aunt    Colon cancer Neg Hx     Past Surgical History:  Procedure Laterality Date   ABDOMINAL HYSTERECTOMY  10/03/1980   APPENDECTOMY  10/03/1962   COLONOSCOPY  12/01/2015   polyps   COLONOSCOPY WITH ESOPHAGOGASTRODUODENOSCOPY (EGD)  01/16/2023   UPPER GASTROINTESTINAL ENDOSCOPY  10/04/2003   UPPER GASTROINTESTINAL ENDOSCOPY  10/03/1997   upper gi series    ROS: Review of Systems Negative except as stated above  PHYSICAL EXAM: BP 114/71 (BP Location: Left Arm, Patient Position: Sitting, Cuff Size: Normal)   Pulse 69   Temp 98.7 F (37.1 C) (Oral)   Ht 5\' 1"  (1.549 m)   Wt 158 lb (71.7 kg)   SpO2 96%   BMI 29.85 kg/m   Physical Exam  General appearance - alert, well appearing, and in no distress Mental status - normal mood, behavior, speech, dress, motor activity, and thought processes Neck - supple, no significant adenopathy Chest - clear to auscultation, no wheezes, rales or rhonchi, symmetric air entry Heart - normal rate, regular rhythm, normal S1, S2, no murmurs, rubs, clicks or gallops Musculoskeletal - LT hand: No edema or erythema noted.  No tenderness on palpation over the thenar eminence.  Mild squaring of the [facility] joint of the thumb Extremities - peripheral pulses normal, no pedal edema, no clubbing or cyanosis      Latest Ref Rng & Units 07/27/2023   12:25 PM 08/23/2022    8:52 AM  CMP  Glucose 70 - 99  mg/dL 95  130   BUN 8 - 27 mg/dL 13  14   Creatinine 8.65 - 1.00 mg/dL 7.84  6.96   Sodium 295 - 144 mmol/L 140  141   Potassium 3.5 - 5.2 mmol/L 4.0  3.8   Chloride 96 - 106 mmol/L 99  99   CO2 20 - 29 mmol/L 22  21   Calcium 8.7 - 10.3 mg/dL 28.4  9.9   Total Protein 6.0 - 8.5 g/dL 7.5  7.6   Total Bilirubin 0.0 - 1.2 mg/dL 0.4  0.3   Alkaline Phos 44 - 121 IU/L 105  114   AST 0 - 40 IU/L 25  34   ALT 0 - 32 IU/L 26  25    Lipid Panel     Component Value Date/Time   CHOL 150 07/27/2023 1225   TRIG 117 07/27/2023 1225   HDL 49 07/27/2023 1225   CHOLHDL 3.1 07/27/2023 1225   LDLCALC 80 07/27/2023 1225    CBC    Component Value Date/Time   WBC 7.0 07/27/2023 1225   RBC 5.29 (H) 07/27/2023 1225   HGB 14.5 07/27/2023 1225   HCT 45.6 07/27/2023 1225   PLT 376 07/27/2023 1225   MCV 86 07/27/2023 1225   MCH 27.4 07/27/2023 1225   MCHC 31.8 07/27/2023 1225   RDW 13.3 07/27/2023 1225    ASSESSMENT AND PLAN: 1. Diabetes mellitus treated with oral medication (HCC) (Primary) At goal.  Continue metformin 850 mg twice a day. Encouraged her to continue healthy eating habits. Encouraged her to use her [employer]/product designer.  She plans to join the gym that is close to her house that does have the Silver sneakers program. - POCT glycosylated hemoglobin (Hb A1C) - POCT glucose (manual entry)  2. Hypertension associated with diabetes (HCC) At goal.  Continue Norvasc 10 mg daily, Cozaar/HCTZ 100/25 mg daily and propranolol 40 mg twice  a day.  3. Hyperlipidemia associated with type 2 diabetes mellitus (HCC) Continue pravastatin.  4. 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  I recommend purchasing Voltaren gel over-the-counter and using it as needed or taking Tylenol as needed.  Patient was given the opportunity to ask questions.  Patient verbalized understanding of the plan and was able to repeat key elements of the plan.   This documentation was completed using  Paediatric nurse.  Any transcriptional errors are unintentional.  Orders Placed This Encounter  Procedures   POCT glycosylated hemoglobin (Hb A1C)   POCT glucose (manual entry)     Requested Prescriptions    No prescriptions requested or ordered in this encounter    Return in about 4 months (around 03/26/2024) for give , Medicare Wellness Visit in 1-2   wks with CMA.  Jonah Blue, MD, FACP

## 2023-11-27 NOTE — Patient Instructions (Signed)
 I recommend using Tylenol or purchasing some Voltaren gel over-the-counter and using it as needed when you get flareup of the pain in the palm of the left hand.

## 2023-12-14 ENCOUNTER — Other Ambulatory Visit: Payer: Self-pay | Admitting: Internal Medicine

## 2023-12-14 DIAGNOSIS — Z1231 Encounter for screening mammogram for malignant neoplasm of breast: Secondary | ICD-10-CM

## 2024-02-21 ENCOUNTER — Other Ambulatory Visit: Payer: Self-pay | Admitting: Internal Medicine

## 2024-02-21 DIAGNOSIS — E1169 Type 2 diabetes mellitus with other specified complication: Secondary | ICD-10-CM

## 2024-02-21 DIAGNOSIS — E1159 Type 2 diabetes mellitus with other circulatory complications: Secondary | ICD-10-CM

## 2024-02-27 NOTE — Progress Notes (Unsigned)
 No chief complaint on file.   HISTORY OF PRESENT ILLNESS:  02/27/24 ALL:  Rebecca Glover is a 68 y.o. female here today for follow up for headaches. She was seen in consult with Dr Billy Bue 09/2022. CT showed 5x61mm calcification, otherwise unremarkable. MRI declined d/t claustrophobia. She was started on topiramate  and rizatriptan . Since,    HISTORY (copied from Dr Margrette Shield previous note)  The patient presents for evaluation of daily headaches which began several years ago. Initially saw ENT who thought these were related to her deviated septum. She was recommended treatment for the deviated septum but her PCP told her headaches were likely allergy related, so she never had anything done.   Headaches occur every morning when she wakes up. They are typically frontal and are associated with photophobia, phonophobia, and nausea. She takes BC powder every day up to 3 times a day for her headaches. This relieves her headache temporarily but the pain always returns.   Headache History: Onset: several years ago Triggers: wakes up with them Aura: none Location: frontal, sometimes occipital Associated Symptoms:             Photophobia: yes             Phonophobia: yes             Nausea: has nausea at baseline due to constipation Worse with activity?: yes Duration of headaches: 30 minutes-1 hour with medication   Headache days per month: 30 Headache free days per month: 0   Current Treatment: Abortive BC powder   Preventative none   Prior Therapies                                 Tylenol  BC Powder Losartan   Lisinopril 10 mg daily   REVIEW OF SYSTEMS: Out of a complete 14 system review of symptoms, the patient complains only of the following symptoms, and all other reviewed systems are negative.   ALLERGIES: Allergies  Allergen Reactions   Codeine Nausea Only     HOME MEDICATIONS: Outpatient Medications Prior to Visit  Medication Sig Dispense Refill   amLODipine   (NORVASC ) 10 MG tablet TAKE 1 TABLET BY MOUTH EVERY DAY 90 tablet 0   Aspirin-Salicylamide-Caffeine (BC HEADACHE POWDER PO) Take 1 packet by mouth daily as needed (headaches).     fluticasone  (FLONASE ) 50 MCG/ACT nasal spray Place 2 sprays into both nostrils daily as needed for allergies or rhinitis. 16 g 1   hydroxypropyl methylcellulose / hypromellose (ISOPTO TEARS / GONIOVISC) 2.5 % ophthalmic solution 1 drop. (Patient not taking: Reported on 11/27/2023)     losartan -hydrochlorothiazide (HYZAAR) 100-25 MG tablet TAKE 1 TABLET BY MOUTH EVERY DAY 90 tablet 0   metFORMIN  (GLUCOPHAGE ) 850 MG tablet TAKE 1 TABLET BY MOUTH TWICE DAILY( EVERY TWELVE HOURS) WITH A MEAL 180 tablet 0   oxymetazoline (AFRIN) 0.05 % nasal spray Place 1 spray into both nostrils 2 (two) times daily. (Patient not taking: Reported on 11/27/2023)     pantoprazole  (PROTONIX ) 40 MG tablet TAKE 1 TABLET BY MOUTH TWICE DAILY 180 tablet 0   pravastatin  (PRAVACHOL ) 40 MG tablet TAKE 1 TABLET BY MOUTH EVERY DAY 90 tablet 0   propranolol  (INDERAL ) 40 MG tablet Take 1 tablet (40 mg total) by mouth 2 (two) times daily. 180 tablet 1   rizatriptan  (MAXALT ) 10 MG tablet Take 1 tablet (10 mg total) by mouth as needed for migraine. May repeat  in 2 hours if needed. 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     PAST SURGICAL HISTORY: Past Surgical History:  Procedure Laterality Date   ABDOMINAL HYSTERECTOMY  10/03/1980   APPENDECTOMY  10/03/1962   COLONOSCOPY   12/01/2015   polyps   COLONOSCOPY WITH ESOPHAGOGASTRODUODENOSCOPY (EGD)  01/16/2023   UPPER GASTROINTESTINAL ENDOSCOPY  10/04/2003   UPPER GASTROINTESTINAL ENDOSCOPY  10/03/1997   upper gi series     FAMILY HISTORY: Family History  Problem Relation Age of Onset   Diabetes Mother    Breast cancer Sister 86   Stroke Sister    Arthritis Maternal Aunt    Colon cancer Neg Hx      SOCIAL HISTORY: Social History   Socioeconomic History   Marital status: Single    Spouse name: Not on file   Number of children: Not on file   Years of education: Not on file   Highest education level: 10th grade  Occupational History   Not on file  Tobacco Use   Smoking status: Former    Current packs/day: 0.25    Average packs/day: 0.3 packs/day for 5.0 years (1.3 ttl pk-yrs)    Types: Cigarettes   Smokeless tobacco: Never  Vaping Use   Vaping status: Never Used  Substance and Sexual Activity   Alcohol use: No   Drug use: No   Sexual activity: Not Currently    Birth control/protection: None  Other Topics Concern   Not on file  Social History Narrative   Not on file   Social Drivers of Health   Financial Resource Strain: Low Risk  (07/27/2023)   Overall Financial Resource Strain (CARDIA)    Difficulty of Paying Living Expenses: Not hard at all  Food Insecurity: No Food Insecurity (07/27/2023)   Hunger Vital Sign    Worried About Radiation protection practitioner of Food in the Last Year: Never true    Ran Out of Food in the Last Year: Never true  Recent Concern: Food Insecurity - Food Insecurity Present (07/24/2023)   Hunger Vital Sign    Worried About Radiation protection practitioner of Food in the Last Year: Sometimes true    Ran Out of Food in the Last Year: Never true  Transportation Needs: No Transportation Needs (07/27/2023)   PRAPARE - Administrator, Civil Service (Medical): No    Lack of Transportation (Non-Medical): No  Physical Activity: Insufficiently Active (07/27/2023)   Exercise Vital Sign     Days of Exercise per Week: 2 days    Minutes of Exercise per Session: 10 min  Stress: No Stress Concern Present (07/27/2023)   [employer] of Occupational Health - Occupational Stress Questionnaire    Feeling of Stress : Not at all  Social Connections: Moderately Integrated (07/27/2023)   Social Connection and Isolation Panel [NHANES]    Frequency of Communication with Friends and Family: More than three times a week    Frequency of Social Gatherings with  Friends and Family: Twice a week    Attends Religious Services: More than 4 times per year    Active Member of [employer] or Organizations: Yes    Attends Engineer, structural: More than 4 times per year    Marital Status: Never married  Intimate Partner Violence: Not At Risk (07/27/2023)   Humiliation, Afraid, Rape, and Kick questionnaire    Fear of Current or Ex-Partner: No    Emotionally Abused: No    Physically Abused: No    Sexually Abused: No     PHYSICAL EXAM  There were no vitals filed for this visit. There is no height or weight on file to calculate BMI.  Generalized: Well developed, in no acute distress  Cardiology: normal rate and rhythm, no murmur auscultated  Respiratory: clear to auscultation bilaterally    Neurological examination  Mentation: Alert oriented to time, place, history taking. Follows all commands speech and language fluent Cranial nerve II-XII: Pupils were equal round reactive to light. Extraocular movements were full, visual field were full on confrontational test. Facial sensation and strength were normal. Uvula tongue midline. Head turning and shoulder shrug  were normal and symmetric. Motor: The motor testing reveals 5 over 5 strength of all 4 extremities. Good symmetric motor tone is noted throughout.  Sensory: Sensory testing is intact to soft touch on all 4 extremities. No evidence of extinction is noted.  Coordination: Cerebellar testing reveals good finger-nose-finger and  heel-to-shin bilaterally.  Gait and station: Gait is normal. Tandem gait is normal. Romberg is negative. No drift is seen.  Reflexes: Deep tendon reflexes are symmetric and normal bilaterally.    DIAGNOSTIC DATA (LABS, IMAGING, TESTING) - I reviewed patient records, labs, notes, testing and imaging myself where available.  Lab Results  Component Value Date   WBC 7.0 07/27/2023   HGB 14.5 07/27/2023   HCT 45.6 07/27/2023   MCV 86 07/27/2023   PLT 376 07/27/2023      Component Value Date/Time   NA 140 07/27/2023 1225   K 4.0 07/27/2023 1225   CL 99 07/27/2023 1225   CO2 22 07/27/2023 1225   GLUCOSE 95 07/27/2023 1225   BUN 13 07/27/2023 1225   CREATININE 0.65 07/27/2023 1225   CALCIUM 10.2 07/27/2023 1225   PROT 7.5 07/27/2023 1225   ALBUMIN 4.7 07/27/2023 1225   AST 25 07/27/2023 1225   ALT 26 07/27/2023 1225   ALKPHOS 105 07/27/2023 1225   BILITOT 0.4 07/27/2023 1225   Lab Results  Component Value Date   CHOL 150 07/27/2023   HDL 49 07/27/2023   LDLCALC 80 07/27/2023   TRIG 117 07/27/2023   CHOLHDL 3.1 07/27/2023   Lab Results  Component Value Date   HGBA1C 6.2 11/27/2023   No results found for: "VITAMINB12" No results found for: "TSH"      No data to display               No data to display           ASSESSMENT AND PLAN  68 y.o. year old female  has a past medical history of Allergy, Diabetes mellitus, GERD (gastroesophageal reflux disease), Hyperlipidemia, Hypertension, and Neuromuscular disorder (HCC) (10/03/2010). here with    No diagnosis found.  Sedrick Dada ***.  Healthy lifestyle habits encouraged. *** will follow up with PCP as directed. *** will return to see me in ***, sooner if needed. *** verbalizes understanding and agreement with this plan.   No orders of  the defined types were placed in this encounter.    No orders of the defined types were placed in this encounter.    Terrilyn Fick, MSN, FNP-C 02/27/2024, 10:04  AM  [facility] [address] [place], [place] [address] [phone]

## 2024-02-27 NOTE — Patient Instructions (Signed)

## 2024-03-01 ENCOUNTER — Ambulatory Visit (INDEPENDENT_AMBULATORY_CARE_PROVIDER_SITE_OTHER): Payer: Medicare (Managed Care) | Admitting: Family Medicine

## 2024-03-01 ENCOUNTER — Encounter: Payer: Self-pay | Admitting: Family Medicine

## 2024-03-01 VITALS — BP 136/75 | HR 79 | Ht 61.0 in | Wt 160.0 lb

## 2024-03-01 DIAGNOSIS — R0683 Snoring: Secondary | ICD-10-CM

## 2024-03-01 DIAGNOSIS — G9389 Other specified disorders of brain: Secondary | ICD-10-CM

## 2024-03-01 DIAGNOSIS — R519 Headache, unspecified: Secondary | ICD-10-CM

## 2024-03-01 DIAGNOSIS — G43709 Chronic migraine without aura, not intractable, without status migrainosus: Secondary | ICD-10-CM | POA: Diagnosis not present

## 2024-03-01 DIAGNOSIS — G4440 Drug-induced headache, not elsewhere classified, not intractable: Secondary | ICD-10-CM

## 2024-03-01 MED ORDER — SUMATRIPTAN SUCCINATE 100 MG PO TABS
100.0000 mg | ORAL_TABLET | Freq: Once | ORAL | 2 refills | Status: AC | PRN
Start: 2024-03-01 — End: ?

## 2024-03-01 MED ORDER — TOPIRAMATE 50 MG PO TABS: 50.0000 mg | ORAL_TABLET | Freq: Two times a day (BID) | ORAL | 3 refills | Status: AC

## 2024-03-09 ENCOUNTER — Other Ambulatory Visit: Payer: Self-pay | Admitting: Internal Medicine

## 2024-03-11 ENCOUNTER — Ambulatory Visit: Payer: Medicare Other | Admitting: Family Medicine

## 2024-03-26 ENCOUNTER — Ambulatory Visit: Payer: Medicare (Managed Care) | Attending: Internal Medicine | Admitting: Internal Medicine

## 2024-03-26 ENCOUNTER — Encounter: Payer: Self-pay | Admitting: Internal Medicine

## 2024-03-26 VITALS — BP 123/77 | HR 67 | Temp 98.2°F | Ht 61.0 in | Wt 156.0 lb

## 2024-03-26 DIAGNOSIS — E119 Type 2 diabetes mellitus without complications: Secondary | ICD-10-CM

## 2024-03-26 DIAGNOSIS — Z7984 Long term (current) use of oral hypoglycemic drugs: Secondary | ICD-10-CM | POA: Diagnosis not present

## 2024-03-26 DIAGNOSIS — E785 Hyperlipidemia, unspecified: Secondary | ICD-10-CM

## 2024-03-26 DIAGNOSIS — I10 Essential (primary) hypertension: Secondary | ICD-10-CM | POA: Diagnosis not present

## 2024-03-26 DIAGNOSIS — I152 Hypertension secondary to endocrine disorders: Secondary | ICD-10-CM

## 2024-03-26 DIAGNOSIS — T887XXA Unspecified adverse effect of drug or medicament, initial encounter: Secondary | ICD-10-CM

## 2024-03-26 DIAGNOSIS — M25531 Pain in right wrist: Secondary | ICD-10-CM | POA: Diagnosis not present

## 2024-03-26 DIAGNOSIS — E1169 Type 2 diabetes mellitus with other specified complication: Secondary | ICD-10-CM

## 2024-03-26 DIAGNOSIS — E118 Type 2 diabetes mellitus with unspecified complications: Secondary | ICD-10-CM

## 2024-03-26 DIAGNOSIS — R1032 Left lower quadrant pain: Secondary | ICD-10-CM

## 2024-03-26 LAB — POCT GLYCOSYLATED HEMOGLOBIN (HGB A1C): HbA1c, POC (controlled diabetic range): 5.9 % (ref 0.0–7.0)

## 2024-03-26 LAB — GLUCOSE, POCT (MANUAL RESULT ENTRY): POC Glucose: 107 mg/dL — AB (ref 70–99)

## 2024-03-26 MED ORDER — METFORMIN HCL 850 MG PO TABS
850.0000 mg | ORAL_TABLET | Freq: Two times a day (BID) | ORAL | 2 refills | Status: AC
Start: 2024-03-26 — End: ?

## 2024-03-26 MED ORDER — AMLODIPINE BESYLATE 10 MG PO TABS: 10.0000 mg | ORAL_TABLET | Freq: Every day | ORAL | 1 refills | Status: AC

## 2024-03-26 MED ORDER — LOSARTAN POTASSIUM-HCTZ 100-25 MG PO TABS: 1.0000 | ORAL_TABLET | Freq: Every day | ORAL | 2 refills | Status: AC

## 2024-03-26 MED ORDER — PRAVASTATIN SODIUM 40 MG PO TABS: 40.0000 mg | ORAL_TABLET | Freq: Every day | ORAL | 2 refills | Status: AC

## 2024-03-26 NOTE — Patient Instructions (Signed)
 VISIT SUMMARY:  During your visit, we reviewed your management of diabetes, hypertension, hyperlipidemia, migraines, right hand pain, and urinary symptoms. We discussed your current medications, lifestyle habits, and made some adjustments to your treatment plan.  YOUR PLAN:  -TYPE 2 DIABETES MELLITUS: Type 2 diabetes is a condition where your body does not use insulin properly, leading to high blood sugar levels. Your diabetes is well-controlled with an A1c of 5.9. Continue taking metformin  850 mg twice daily, increase your water intake, reduce sugary drinks, and maintain regular exercise and healthy eating habits.  -HYPERTENSION: Hypertension, or high blood pressure, is a condition where the force of the blood against your artery walls is too high. Continue taking losartan /hydrochlorothiazide 100/25 mg once daily, lisinopril 40 mg twice daily, and amlodipine  10 mg daily. Keep limiting your salt intake.  -HYPERLIPIDEMIA: Hyperlipidemia is a condition with high levels of fats (lipids) in your blood. Your LDL cholesterol is at a target level. Continue taking pravastatin  as prescribed.  -MIGRAINE: Migraines are severe headaches often accompanied by nausea and sensitivity to light. You have discontinued Topamax  due to side effects. Continue using sumatriptan  for acute migraine attacks and propranolol  for prevention. Inform your neurologist about stopping Topamax .  -RIGHT HAND PAIN: You have intermittent pain in your right hand without swelling. Consider using [address] [place], [place], or Tiger Balm for relief.  -URINARY SYMPTOMS: You have occasional urinary urgency with minimal output. We will check for infection with a urinalysis. Continue to increase your water intake.  INSTRUCTIONS:  Please follow up with a urinalysis to check for infection. Inform your neurologist about the discontinuation of Topamax . Continue with your current medications and lifestyle modifications as discussed. If you experience  any new or worsening symptoms, please contact our office.

## 2024-03-26 NOTE — Progress Notes (Signed)
 Patient ID: Rebecca Glover, female    DOB: 08/20/1956  MRN: [id number]  CC: Diabetes (DM & HTN f/u. Med refills. /Lower L abdominal pain - currently having some pressure when urinating, urinary frequency X2 weeks/On-going pain of R wrist and hand /Reports Topamax  causing jittery, nausea, loopy symptoms /)   Subjective: Rebecca Glover is a 68 y.o. female who presents for chronic ds management. Her concerns today include:  Pt with hx of DM, HTN, migraine (Dr. Rush, CT with 5 mm Ca+ ? Meningioma)   Discussed the use of AI scribe software for clinical note transcription with the patient, who gave verbal consent to proceed.  History of Present Illness Rebecca Glover is a 68 year old female who presents for a four-month follow-up.  DM: Results for orders placed or performed in visit on 03/26/24  POCT glycosylated hemoglobin (Hb A1C)   Collection Time: 03/26/24  1:50 PM  Result Value Ref Range   Hemoglobin A1C     HbA1c POC (<> result, manual entry)     HbA1c, POC (prediabetic range)     HbA1c, POC (controlled diabetic range) 5.9 0.0 - 7.0 %  POCT glucose (manual entry)   Collection Time: 03/26/24  1:51 PM  Result Value Ref Range   POC Glucose 107 (A) 70 - 99 mg/dl  She manages diabetes with metformin  850 mg twice daily but does not regularly monitor blood glucose levels. Her diet includes fruits, some raw vegetables, and occasional salads, with limited fried foods. She has reduced ginger ale intake and increased water consumption.  She goes to the gym twice a week.  She also has a treadmill walking manage at home that she uses several times a week.  She continues to take the Pravachol  for cholesterol.  Hypertension is managed with losartan /hydrochlorothiazide 100/25 mg once daily and lisinopril 40 mg twice daily. She limits salt intake.  She takes pravastatin  for hyperlipidemia, with an LDL cholesterol of 80 recorded last October.  Intermittent right hand pain occurs at the base near the  wrist without swelling.  No pain at this time.  She uses Nervine roll-on cream due to unavailability of Voltaren gel.  She experienced left lower abdominal pain two weeks ago that woke her from sleep.  It was associated with mild dysuria.  She suspected to be kidney-related, so she increased water intake with Azo with relief. Pain resolved but hassome urinary urgency with minimal output is noted, without burning, fever, or hematuria.  Saw neurology recently for follow-up on migraines.  She was started on Topamax  50 mg with plan to increase to twice daily dosing.  She discontinued Topamax  after only a few weeks due to medication making her feel loopy and out of it.  She reports no increase in migraine frequency.  She continues to take propranolol  40 mg twice a day.  She tells me the neurologist has ordered a sleep study to evaluate for sleep apnea..    Patient Active Problem List   Diagnosis Date Noted   Type 2 diabetes mellitus with hyperlipidemia (HCC) 08/18/2022   Hypertension associated with diabetes (HCC) 08/18/2022   Chronic daily headache 08/18/2022   Chronic congestion of paranasal sinus 08/18/2022   HYPERTENSION, BENIGN SYSTEMIC 11/30/2006   IRRITABLE BOWEL SYNDROME 11/30/2006     Current Outpatient Medications on File Prior to Visit  Medication Sig Dispense Refill   Aspirin-Salicylamide-Caffeine (BC HEADACHE POWDER PO) Take 1 packet by mouth daily as needed (headaches).     fluticasone  (FLONASE )  50 MCG/ACT nasal spray Place 2 sprays into both nostrils daily as needed for allergies or rhinitis. 16 g 1   Naphazoline HCl (CLEAR EYES OP) Apply to eye.     pantoprazole  (PROTONIX ) 40 MG tablet TAKE 1 TABLET BY MOUTH TWICE DAILY 180 tablet 0   propranolol  (INDERAL ) 40 MG tablet TAKE 1 TABLET BY MOUTH TWICE DAILY 180 tablet 1   SUMAtriptan  (IMITREX ) 100 MG tablet Take 1 tablet (100 mg total) by mouth once as needed for up to 1 dose for migraine. May repeat in 2 hours if headache persists or  recurs. 10 tablet 2   topiramate  (TOPAMAX ) 50 MG tablet Take 1 tablet (50 mg total) by mouth 2 (two) times daily. 180 tablet 3   Current Facility-Administered Medications on File Prior to Visit  Medication Dose Route Frequency Provider Last Rate Last Admin   0.9 %  sodium chloride  infusion  500 mL Intravenous Once Nandigam, Kavitha V, MD        Allergies  Allergen Reactions   Codeine Nausea Only    Social History   Socioeconomic History   Marital status: Single    Spouse name: Not on file   Number of children: Not on file   Years of education: Not on file   Highest education level: 10th grade  Occupational History   Not on file  Tobacco Use   Smoking status: Former    Current packs/day: 0.25    Average packs/day: 0.3 packs/day for 5.0 years (1.3 ttl pk-yrs)    Types: Cigarettes   Smokeless tobacco: Never  Vaping Use   Vaping status: Never Used  Substance and Sexual Activity   Alcohol use: No   Drug use: No   Sexual activity: Not Currently    Birth control/protection: None  Other Topics Concern   Not on file  Social History Narrative   Pt lives alone    Retired    Social Drivers of Corporate investment banker Strain: Low Risk  (07/27/2023)   Overall Financial Resource Strain (CARDIA)    Difficulty of Paying Living Expenses: Not hard at all  Food Insecurity: No Food Insecurity (07/27/2023)   Hunger Vital Sign    Worried About Running Out of Food in the Last Year: Never true    Ran Out of Food in the Last Year: Never true  Recent Concern: Food Insecurity - Food Insecurity Present (07/24/2023)   Hunger Vital Sign    Worried About Running Out of Food in the Last Year: Sometimes true    Ran Out of Food in the Last Year: Never true  Transportation Needs: No Transportation Needs (07/27/2023)   PRAPARE - Administrator, Civil Service (Medical): No    Lack of Transportation (Non-Medical): No  Physical Activity: Insufficiently Active (07/27/2023)   Exercise  Vital Sign    Days of Exercise per Week: 2 days    Minutes of Exercise per Session: 10 min  Stress: No Stress Concern Present (07/27/2023)   [employer] of Occupational Health - Occupational Stress Questionnaire    Feeling of Stress : Not at all  Social Connections: Moderately Integrated (07/27/2023)   Social Connection and Isolation Panel    Frequency of Communication with Friends and Family: More than three times a week    Frequency of Social Gatherings with Friends and Family: Twice a week    Attends Religious Services: More than 4 times per year    Active Member of [employer] or Organizations: Yes  Attends Banker Meetings: More than 4 times per year    Marital Status: Never married  Intimate Partner Violence: Not At Risk (07/27/2023)   Humiliation, Afraid, Rape, and Kick questionnaire    Fear of Current or Ex-Partner: No    Emotionally Abused: No    Physically Abused: No    Sexually Abused: No    Family History  Problem Relation Age of Onset   Diabetes Mother    Breast cancer Sister 54   Stroke Sister    Sleep apnea Sister    Arthritis Maternal Aunt    Sleep apnea Nephew    Colon cancer Neg Hx     Past Surgical History:  Procedure Laterality Date   ABDOMINAL HYSTERECTOMY  10/03/1980   APPENDECTOMY  10/03/1962   COLONOSCOPY  12/01/2015   polyps   COLONOSCOPY WITH ESOPHAGOGASTRODUODENOSCOPY (EGD)  01/16/2023   UPPER GASTROINTESTINAL ENDOSCOPY  10/04/2003   UPPER GASTROINTESTINAL ENDOSCOPY  10/03/1997   upper gi series    ROS: Review of Systems Negative except as stated above  PHYSICAL EXAM: BP 123/77 (BP Location: Left Arm, Patient Position: Sitting, Cuff Size: Large)   Pulse 67   Temp 98.2 F (36.8 C) (Oral)   Ht 5' 1 (1.549 m)   Wt 156 lb (70.8 kg)   SpO2 97%   BMI 29.48 kg/m   Wt Readings from Last 3 Encounters:  03/26/24 156 lb (70.8 kg)  03/01/24 160 lb (72.6 kg)  11/27/23 158 lb (71.7 kg)    Physical Exam  General appearance  - alert, well appearing, older African-American female and in no distress Mental status - normal mood, behavior, speech, dress, motor activity, and thought processes Chest - clear to auscultation, no wheezes, rales or rhonchi, symmetric air entry Heart - normal rate, regular rhythm, normal S1, S2, no murmurs, rubs, clicks or gallops Abdomen - soft, nontender, nondistended, no masses or organomegaly Extremities - peripheral pulses normal, no pedal edema, no clubbing or cyanosis RT wrist/hand:  no edema or erythema.  Good ROM Diabetic Foot Exam - Simple   Simple Foot Form Diabetic Foot exam was performed with the following findings: Yes 03/26/2024  2:16 PM  Visual Inspection No deformities, no ulcerations, no other skin breakdown bilaterally: Yes Sensation Testing Intact to touch and monofilament testing bilaterally: Yes Pulse Check Posterior Tibialis and Dorsalis pulse intact bilaterally: Yes Comments     Results for orders placed or performed in visit on 03/26/24  POCT glycosylated hemoglobin (Hb A1C)   Collection Time: 03/26/24  1:50 PM  Result Value Ref Range   Hemoglobin A1C     HbA1c POC (<> result, manual entry)     HbA1c, POC (prediabetic range)     HbA1c, POC (controlled diabetic range) 5.9 0.0 - 7.0 %  POCT glucose (manual entry)   Collection Time: 03/26/24  1:51 PM  Result Value Ref Range   POC Glucose 107 (A) 70 - 99 mg/dl       Latest Ref Rng & Units 07/27/2023   12:25 PM 08/23/2022    8:52 AM  CMP  Glucose 70 - 99 mg/dL 95  897   BUN 8 - 27 mg/dL 13  14   Creatinine 9.42 - 1.00 mg/dL 9.34  9.23   Sodium 865 - 144 mmol/L 140  141   Potassium 3.5 - 5.2 mmol/L 4.0  3.8   Chloride 96 - 106 mmol/L 99  99   CO2 20 - 29 mmol/L 22  21   Calcium  8.7 - 10.3 mg/dL 89.7  9.9   Total Protein 6.0 - 8.5 g/dL 7.5  7.6   Total Bilirubin 0.0 - 1.2 mg/dL 0.4  0.3   Alkaline Phos 44 - 121 IU/L 105  114   AST 0 - 40 IU/L 25  34   ALT 0 - 32 IU/L 26  25    Lipid Panel      Component Value Date/Time   CHOL 150 07/27/2023 1225   TRIG 117 07/27/2023 1225   HDL 49 07/27/2023 1225   CHOLHDL 3.1 07/27/2023 1225   LDLCALC 80 07/27/2023 1225    CBC    Component Value Date/Time   WBC 7.0 07/27/2023 1225   RBC 5.29 (H) 07/27/2023 1225   HGB 14.5 07/27/2023 1225   HCT 45.6 07/27/2023 1225   PLT 376 07/27/2023 1225   MCV 86 07/27/2023 1225   MCH 27.4 07/27/2023 1225   MCHC 31.8 07/27/2023 1225   RDW 13.3 07/27/2023 1225    ASSESSMENT AND PLAN: 1. Controlled type 2 diabetes mellitus with complication, without long-term current use of insulin (HCC) (Primary) -Diabetes type 2 treated with oral medication Well-controlled with A1c of 5.9. Managed with metformin  and lifestyle modifications. - Continue metformin  850 mg twice daily. - Continue regular exercise and healthy eating habits. - POCT glycosylated hemoglobin (Hb A1C) - POCT glucose (manual entry) - metFORMIN  (GLUCOPHAGE ) 850 MG tablet; Take 1 tablet (850 mg total) by mouth 2 (two) times daily with a meal.  Dispense: 180 tablet; Refill: 2  2. Hypertension associated with diabetes (HCC) At goal.  Continue amlodipine  10 mg daily, Cozaar /HCTZ 100/25 mg daily and propranolol  40 mg twice a day. - amLODipine  (NORVASC ) 10 MG tablet; Take 1 tablet (10 mg total) by mouth daily.  Dispense: 90 tablet; Refill: 1 - losartan -hydrochlorothiazide (HYZAAR) 100-25 MG tablet; Take 1 tablet by mouth daily.  Dispense: 90 tablet; Refill: 2  3. Hyperlipidemia associated with type 2 diabetes mellitus (HCC) - pravastatin  (PRAVACHOL ) 40 MG tablet; Take 1 tablet (40 mg total) by mouth daily.  Dispense: 90 tablet; Refill: 2  4. Right wrist pain Asymptomatic at this time.  Advised that she can use IcyHot rub or Bengay over-the-counter as needed if she is unable to afford Voltaren gel  5. Left lower quadrant abdominal pain Pain has resolved but having some urinary frequency with decreased urine output - Urinalysis, Routine w  reflex microscopic   Patient was given the opportunity to ask questions.  Patient verbalized understanding of the plan and was able to repeat key elements of the plan.   This documentation was completed using Paediatric nurse.  Any transcriptional errors are unintentional.  Orders Placed This Encounter  Procedures   Urinalysis, Routine w reflex microscopic   POCT glycosylated hemoglobin (Hb A1C)   POCT glucose (manual entry)     Requested Prescriptions   Signed Prescriptions Disp Refills   amLODipine  (NORVASC ) 10 MG tablet 90 tablet 1    Sig: Take 1 tablet (10 mg total) by mouth daily.   losartan -hydrochlorothiazide (HYZAAR) 100-25 MG tablet 90 tablet 2    Sig: Take 1 tablet by mouth daily.   metFORMIN  (GLUCOPHAGE ) 850 MG tablet 180 tablet 2    Sig: Take 1 tablet (850 mg total) by mouth 2 (two) times daily with a meal.   pravastatin  (PRAVACHOL ) 40 MG tablet 90 tablet 2    Sig: Take 1 tablet (40 mg total) by mouth daily.    Return in about 4 months (around  07/26/2024).  Barnie Louder, MD, FACP

## 2024-03-27 ENCOUNTER — Ambulatory Visit: Payer: Self-pay | Admitting: Internal Medicine

## 2024-03-27 LAB — URINALYSIS, ROUTINE W REFLEX MICROSCOPIC
Bilirubin, UA: NEGATIVE
Glucose, UA: NEGATIVE
Ketones, UA: NEGATIVE
Nitrite, UA: NEGATIVE
Protein,UA: NEGATIVE
Specific Gravity, UA: 1.013 (ref 1.005–1.030)
Urobilinogen, Ur: 0.2 mg/dL (ref 0.2–1.0)
pH, UA: 6 (ref 5.0–7.5)

## 2024-03-27 LAB — MICROSCOPIC EXAMINATION
Bacteria, UA: NONE SEEN
Casts: NONE SEEN /LPF

## 2024-06-10 ENCOUNTER — Encounter: Payer: Self-pay | Admitting: Internal Medicine

## 2024-06-12 ENCOUNTER — Telehealth: Payer: Self-pay | Admitting: Internal Medicine

## 2024-06-12 NOTE — Telephone Encounter (Signed)
 Confirmed appt for 9/11

## 2024-06-13 ENCOUNTER — Ambulatory Visit: Payer: Medicare (Managed Care) | Attending: Internal Medicine | Admitting: Internal Medicine

## 2024-06-13 ENCOUNTER — Encounter: Payer: Self-pay | Admitting: Internal Medicine

## 2024-06-13 VITALS — BP 135/83 | HR 71 | Ht 61.0 in | Wt 155.0 lb

## 2024-06-13 DIAGNOSIS — I152 Hypertension secondary to endocrine disorders: Secondary | ICD-10-CM | POA: Diagnosis not present

## 2024-06-13 DIAGNOSIS — N390 Urinary tract infection, site not specified: Secondary | ICD-10-CM | POA: Diagnosis not present

## 2024-06-13 DIAGNOSIS — N898 Other specified noninflammatory disorders of vagina: Secondary | ICD-10-CM | POA: Diagnosis not present

## 2024-06-13 DIAGNOSIS — Z2821 Immunization not carried out because of patient refusal: Secondary | ICD-10-CM | POA: Diagnosis not present

## 2024-06-13 DIAGNOSIS — R319 Hematuria, unspecified: Secondary | ICD-10-CM

## 2024-06-13 DIAGNOSIS — E1159 Type 2 diabetes mellitus with other circulatory complications: Secondary | ICD-10-CM

## 2024-06-13 DIAGNOSIS — Z87891 Personal history of nicotine dependence: Secondary | ICD-10-CM

## 2024-06-13 DIAGNOSIS — Z79899 Other long term (current) drug therapy: Secondary | ICD-10-CM

## 2024-06-13 LAB — POCT URINALYSIS DIP (CLINITEK)
Bilirubin, UA: NEGATIVE
Glucose, UA: NEGATIVE mg/dL
Ketones, POC UA: NEGATIVE mg/dL
Nitrite, UA: NEGATIVE
POC PROTEIN,UA: NEGATIVE
Spec Grav, UA: 1.01 (ref 1.010–1.025)
Urobilinogen, UA: 0.2 U/dL
pH, UA: 6 (ref 5.0–8.0)

## 2024-06-13 MED ORDER — FLUCONAZOLE 150 MG PO TABS: 150.0000 mg | ORAL_TABLET | Freq: Every day | ORAL | 0 refills | Status: AC

## 2024-06-13 MED ORDER — SULFAMETHOXAZOLE-TRIMETHOPRIM 800-160 MG PO TABS: 1.0000 | ORAL_TABLET | Freq: Two times a day (BID) | ORAL | 0 refills | Status: DC

## 2024-06-13 NOTE — Progress Notes (Signed)
 Patient ID: Rebecca Glover, female    DOB: 1956/09/07  MRN: [id number]  CC: Urinary Tract Infection (Urge to urinate/Flank and back pain)   Subjective: Rebecca Glover is a 68 y.o. female who presents for urgent care visit. Her concerns today include:  Pt with hx of DM, HTN, migraine (Dr. Rush, CT with 5 mm Ca+ ? Meningioma)   Patient presents with 7 days of intermittent suprapubic pressure, urinary urgency, and frequency. Also reports three episodes of hematuria two nights ago associated with straining to void. She also reports an episode where she had a few hours of R sided flank pain that have since resolved. She tried azole for 1-2 days but discontinued due to upcoming visit. Endorses sensation of incomplete bladder emptying and chronic stress incontinence for a few months to a year, but denies urge incontinence. Notes intermittent vaginal itching at the opening of her vagina. Denies dysuria, abdominal pain, or vaginal bleeding. She is unsure about vaginal discharge. Denies new soaps, detergents, or tight clothing. Wears cotton underwear. Past history of yeast infection earlier this year with similar suprapubic pressure that improved with azole feels similar to current sx.   HTN:  BP 133/79 initial; 135/83 on recheck Took her BP pills this morning. Checks BP sometimes and reports it runs 130-170s systolic at home. She is trying to limit salt in diet but is struggling with it.  Reports compliance with taking Hyzaar 100/25 mg daily, amlodipine  10 mg daily, and propranolol  40 mg twice a day  HM:  Declined today. Wants to get flu shot at next visit.   Patient Active Problem List   Diagnosis Date Noted   Type 2 diabetes mellitus with hyperlipidemia (HCC) 08/18/2022   Hypertension associated with diabetes (HCC) 08/18/2022   Chronic daily headache 08/18/2022   Chronic congestion of paranasal sinus 08/18/2022   HYPERTENSION, BENIGN SYSTEMIC 11/30/2006   IRRITABLE BOWEL SYNDROME 11/30/2006      Current Outpatient Medications on File Prior to Visit  Medication Sig Dispense Refill   amLODipine  (NORVASC ) 10 MG tablet Take 1 tablet (10 mg total) by mouth daily. 90 tablet 1   Aspirin-Salicylamide-Caffeine (BC HEADACHE POWDER PO) Take 1 packet by mouth daily as needed (headaches).     losartan -hydrochlorothiazide (HYZAAR) 100-25 MG tablet Take 1 tablet by mouth daily. 90 tablet 2   metFORMIN  (GLUCOPHAGE ) 850 MG tablet Take 1 tablet (850 mg total) by mouth 2 (two) times daily with a meal. 180 tablet 2   Naphazoline HCl (CLEAR EYES OP) Apply to eye.     pantoprazole  (PROTONIX ) 40 MG tablet TAKE 1 TABLET BY MOUTH TWICE DAILY 180 tablet 0   pravastatin  (PRAVACHOL ) 40 MG tablet Take 1 tablet (40 mg total) by mouth daily. 90 tablet 2   propranolol  (INDERAL ) 40 MG tablet TAKE 1 TABLET BY MOUTH TWICE DAILY 180 tablet 1   fluticasone  (FLONASE ) 50 MCG/ACT nasal spray Place 2 sprays into both nostrils daily as needed for allergies or rhinitis. (Patient not taking: Reported on 06/13/2024) 16 g 1   SUMAtriptan  (IMITREX ) 100 MG tablet Take 1 tablet (100 mg total) by mouth once as needed for up to 1 dose for migraine. May repeat in 2 hours if headache persists or recurs. (Patient not taking: Reported on 06/13/2024) 10 tablet 2   topiramate  (TOPAMAX ) 50 MG tablet Take 1 tablet (50 mg total) by mouth 2 (two) times daily. (Patient not taking: Reported on 06/13/2024) 180 tablet 3   Current Facility-Administered Medications on File Prior to  Visit  Medication Dose Route Frequency Provider Last Rate Last Admin   0.9 %  sodium chloride  infusion  500 mL Intravenous Once Nandigam, Kavitha V, MD        Allergies  Allergen Reactions   Codeine Nausea Only    Social History   Socioeconomic History   Marital status: Single    Spouse name: Not on file   Number of children: Not on file   Years of education: Not on file   Highest education level: 10th grade  Occupational History   Not on file  Tobacco Use    Smoking status: Former    Current packs/day: 0.25    Average packs/day: 0.3 packs/day for 5.0 years (1.3 ttl pk-yrs)    Types: Cigarettes   Smokeless tobacco: Never  Vaping Use   Vaping status: Never Used  Substance and Sexual Activity   Alcohol use: No   Drug use: No   Sexual activity: Not Currently    Birth control/protection: None  Other Topics Concern   Not on file  Social History Narrative   Pt lives alone    Retired    Social Drivers of Corporate investment banker Strain: Low Risk  (07/27/2023)   Overall Financial Resource Strain (CARDIA)    Difficulty of Paying Living Expenses: Not hard at all  Food Insecurity: No Food Insecurity (07/27/2023)   Hunger Vital Sign    Worried About Running Out of Food in the Last Year: Never true    Ran Out of Food in the Last Year: Never true  Recent Concern: Food Insecurity - Food Insecurity Present (07/24/2023)   Hunger Vital Sign    Worried About Running Out of Food in the Last Year: Sometimes true    Ran Out of Food in the Last Year: Never true  Transportation Needs: No Transportation Needs (07/27/2023)   PRAPARE - Administrator, Civil Service (Medical): No    Lack of Transportation (Non-Medical): No  Physical Activity: Insufficiently Active (07/27/2023)   Exercise Vital Sign    Days of Exercise per Week: 2 days    Minutes of Exercise per Session: 10 min  Stress: No Stress Concern Present (07/27/2023)   [employer] of Occupational Health - Occupational Stress Questionnaire    Feeling of Stress : Not at all  Social Connections: Moderately Integrated (07/27/2023)   Social Connection and Isolation Panel    Frequency of Communication with Friends and Family: More than three times a week    Frequency of Social Gatherings with Friends and Family: Twice a week    Attends Religious Services: More than 4 times per year    Active Member of [employer] or Organizations: Yes    Attends Banker Meetings: More than  4 times per year    Marital Status: Never married  Intimate Partner Violence: Not At Risk (07/27/2023)   Humiliation, Afraid, Rape, and Kick questionnaire    Fear of Current or Ex-Partner: No    Emotionally Abused: No    Physically Abused: No    Sexually Abused: No    Family History  Problem Relation Age of Onset   Diabetes Mother    Breast cancer Sister 53   Stroke Sister    Sleep apnea Sister    Arthritis Maternal Aunt    Sleep apnea Nephew    Colon cancer Neg Hx     Past Surgical History:  Procedure Laterality Date   ABDOMINAL HYSTERECTOMY  10/03/1980   APPENDECTOMY  10/03/1962   COLONOSCOPY  12/01/2015   polyps   COLONOSCOPY WITH ESOPHAGOGASTRODUODENOSCOPY (EGD)  01/16/2023   UPPER GASTROINTESTINAL ENDOSCOPY  10/04/2003   UPPER GASTROINTESTINAL ENDOSCOPY  10/03/1997   upper gi series    ROS: Review of Systems Negative except as stated above  PHYSICAL EXAM: BP 135/83   Pulse 71   Ht 5' 1 (1.549 m)   Wt 70.3 kg   SpO2 99%   BMI 29.29 kg/m   Physical Exam  General appearance - alert, well appearing, overweight AAF and in no distress Mental status - normal mood, behavior, speech, dress, motor activity, and thought processes Abdomen - soft, nontender to palpation, nondistended, no masses or organomegaly Back exam - no CVA tenderness Extremities - no pedal edema noted Skin - warm and dry     Latest Ref Rng & Units 07/27/2023   12:25 PM 08/23/2022    8:52 AM  CMP  Glucose 70 - 99 mg/dL 95  897   BUN 8 - 27 mg/dL 13  14   Creatinine 9.42 - 1.00 mg/dL 9.34  9.23   Sodium 865 - 144 mmol/L 140  141   Potassium 3.5 - 5.2 mmol/L 4.0  3.8   Chloride 96 - 106 mmol/L 99  99   CO2 20 - 29 mmol/L 22  21   Calcium 8.7 - 10.3 mg/dL 89.7  9.9   Total Protein 6.0 - 8.5 g/dL 7.5  7.6   Total Bilirubin 0.0 - 1.2 mg/dL 0.4  0.3   Alkaline Phos 44 - 121 IU/L 105  114   AST 0 - 40 IU/L 25  34   ALT 0 - 32 IU/L 26  25    Lipid Panel     Component Value Date/Time    CHOL 150 07/27/2023 1225   TRIG 117 07/27/2023 1225   HDL 49 07/27/2023 1225   CHOLHDL 3.1 07/27/2023 1225   LDLCALC 80 07/27/2023 1225    CBC    Component Value Date/Time   WBC 7.0 07/27/2023 1225   RBC 5.29 (H) 07/27/2023 1225   HGB 14.5 07/27/2023 1225   HCT 45.6 07/27/2023 1225   PLT 376 07/27/2023 1225   MCV 86 07/27/2023 1225   MCH 27.4 07/27/2023 1225   MCHC 31.8 07/27/2023 1225   RDW 13.3 07/27/2023 1225    Results for orders placed or performed in visit on 06/13/24  POCT URINALYSIS DIP (CLINITEK)   Collection Time: 06/13/24  2:31 PM  Result Value Ref Range   Color, UA yellow yellow   Clarity, UA cloudy (A) clear   Glucose, UA negative negative mg/dL   Bilirubin, UA negative negative   Ketones, POC UA negative negative mg/dL   Spec Grav, UA 8.989 8.989 - 1.025   Blood, UA moderate (A) negative   pH, UA 6.0 5.0 - 8.0   POC PROTEIN,UA negative negative, trace   Urobilinogen, UA 0.2 0.2 or 1.0 E.U./dL   Nitrite, UA Negative Negative   Leukocytes, UA Moderate (2+) (A) Negative    ASSESSMENT AND PLAN:  Assessment and Plan  1. Urinary tract infection with hematuria, site unspecified (Primary) Pt is symptomatic with UA suggesting UTI. No signs of systemic infection at this time. Advised her to take Bactrim  twice a day for 5 days, and return in 1 week for repeat UA and to determine if hematuria resolves. - POCT URINALYSIS DIP (CLINITEK) - sulfamethoxazole -trimethoprim  (BACTRIM  DS) 800-160 MG tablet; Take 1 tablet by mouth 2 (two) times daily.  Dispense:  10 tablet; Refill: 0 - Urinalysis, Routine w reflex microscopic; Future - Urine Culture - Urinalysis, Routine w reflex microscopic  2. Vaginal itching Possible recurring yeast infection. Will give one tablet of fluconazole  for vaginal itching. - fluconazole  (DIFLUCAN ) 150 MG tablet; Take 1 tablet (150 mg total) by mouth daily for 1 day.  Dispense: 30 tablet; Refill: 0  3. Hypertension associated with diabetes  (HCC) Not at goal. Continue amlodipine , Hyzaar, and inderal  as prescribed. Will plan to recheck BP at her next visit.  4. Influenza vaccination declined  Patient was given the opportunity to ask questions.  Patient verbalized understanding of the plan and was able to repeat key elements of the plan.    Orders Placed This Encounter  Procedures   Urine Culture   Urinalysis, Routine w reflex microscopic   POCT URINALYSIS DIP (CLINITEK)     Requested Prescriptions   Signed Prescriptions Disp Refills   fluconazole  (DIFLUCAN ) 150 MG tablet 30 tablet 0    Sig: Take 1 tablet (150 mg total) by mouth daily for 1 day.   sulfamethoxazole -trimethoprim  (BACTRIM  DS) 800-160 MG tablet 10 tablet 0    Sig: Take 1 tablet by mouth 2 (two) times daily.    Return for Give lad appt in 1 week for repeat urinalysis.  This is a Psychologist, occupational Note.  The care of the patient was discussed with Dr. Vicci and the assessment and plan formulated with her assistance.  Please see her attestation of this encounter.  Duwaine Amber, MS3 [employer]  Evaluation and management procedures were performed by me with Medical Student in attendance, note written by Medical Student under my supervision and collaboration. I have reviewed the note and I agree with the management and plan.  Barnie Vicci, MD, FAAFP. [facility] [place], [place] [phone]   06/13/2024, 5:50 PM

## 2024-06-13 NOTE — Patient Instructions (Signed)
 Take Bactrim  twice a day for 5 days for your UTI symptoms.  Take 1 tablet of fluconazole  for vaginal itching.

## 2024-06-14 ENCOUNTER — Telehealth: Payer: Self-pay | Admitting: Internal Medicine

## 2024-06-14 LAB — URINALYSIS, ROUTINE W REFLEX MICROSCOPIC
Bilirubin, UA: NEGATIVE
Glucose, UA: NEGATIVE
Ketones, UA: NEGATIVE
Nitrite, UA: NEGATIVE
Specific Gravity, UA: 1.01 (ref 1.005–1.030)
Urobilinogen, Ur: 0.2 mg/dL (ref 0.2–1.0)
pH, UA: 6.5 (ref 5.0–7.5)

## 2024-06-14 LAB — MICROSCOPIC EXAMINATION
Bacteria, UA: NONE SEEN
Casts: NONE SEEN /LPF
Epithelial Cells (non renal): NONE SEEN /HPF (ref 0–10)
WBC, UA: >30 /HPF — AB (ref 0–5)

## 2024-06-14 NOTE — Telephone Encounter (Signed)
-----   Message from Barnie Louder sent at 06/13/2024  5:49 PM EDT ----- Regarding: Lab appt This pt needs a lab appt no Thursday or Friday of next week for repeat urinalysis.

## 2024-06-14 NOTE — Telephone Encounter (Signed)
 Lab appointment scheduled for 06/20/2024.

## 2024-06-17 ENCOUNTER — Other Ambulatory Visit: Payer: Self-pay | Admitting: Internal Medicine

## 2024-06-17 ENCOUNTER — Ambulatory Visit: Payer: Self-pay | Admitting: Internal Medicine

## 2024-06-17 LAB — URINE CULTURE

## 2024-06-17 MED ORDER — CIPROFLOXACIN HCL 500 MG PO TABS
500.0000 mg | ORAL_TABLET | Freq: Two times a day (BID) | ORAL | 0 refills | Status: AC
Start: 2024-06-17 — End: 2024-06-24

## 2024-06-19 ENCOUNTER — Encounter: Payer: Self-pay | Admitting: Internal Medicine

## 2024-06-19 ENCOUNTER — Other Ambulatory Visit: Payer: Self-pay | Admitting: Internal Medicine

## 2024-06-19 DIAGNOSIS — R310 Gross hematuria: Secondary | ICD-10-CM

## 2024-06-20 ENCOUNTER — Other Ambulatory Visit: Payer: Medicare (Managed Care)

## 2024-06-24 ENCOUNTER — Ambulatory Visit: Payer: Medicare (Managed Care) | Admitting: Internal Medicine

## 2024-06-25 ENCOUNTER — Ambulatory Visit: Payer: Medicare (Managed Care) | Attending: Internal Medicine

## 2024-06-25 DIAGNOSIS — R310 Gross hematuria: Secondary | ICD-10-CM

## 2024-06-26 ENCOUNTER — Ambulatory Visit: Payer: Self-pay | Admitting: Internal Medicine

## 2024-06-26 LAB — URINALYSIS, ROUTINE W REFLEX MICROSCOPIC
Bilirubin, UA: NEGATIVE
Glucose, UA: NEGATIVE
Ketones, UA: NEGATIVE
Leukocytes,UA: NEGATIVE
Nitrite, UA: NEGATIVE
Protein,UA: NEGATIVE
RBC, UA: NEGATIVE
Specific Gravity, UA: 1.016 (ref 1.005–1.030)
Urobilinogen, Ur: 0.2 mg/dL (ref 0.2–1.0)
pH, UA: 6.5 (ref 5.0–7.5)

## 2024-07-03 DIAGNOSIS — H5203 Hypermetropia, bilateral: Secondary | ICD-10-CM | POA: Diagnosis not present

## 2024-07-03 DIAGNOSIS — H524 Presbyopia: Secondary | ICD-10-CM | POA: Diagnosis not present

## 2024-07-03 DIAGNOSIS — H52223 Regular astigmatism, bilateral: Secondary | ICD-10-CM | POA: Diagnosis not present

## 2024-07-03 DIAGNOSIS — H31002 Unspecified chorioretinal scars, left eye: Secondary | ICD-10-CM | POA: Diagnosis not present

## 2024-07-19 ENCOUNTER — Ambulatory Visit
Admission: RE | Admit: 2024-07-19 | Discharge: 2024-07-19 | Disposition: A | Discharge status: Home / Self Care | Payer: Medicare (Managed Care) | Source: Ambulatory Visit | Attending: Internal Medicine | Admitting: Internal Medicine

## 2024-07-19 DIAGNOSIS — Z1231 Encounter for screening mammogram for malignant neoplasm of breast: Secondary | ICD-10-CM

## 2024-07-24 ENCOUNTER — Ambulatory Visit: Payer: Self-pay | Admitting: Internal Medicine

## 2024-07-26 ENCOUNTER — Ambulatory Visit: Payer: Medicare (Managed Care) | Attending: Internal Medicine | Admitting: Internal Medicine

## 2024-07-26 ENCOUNTER — Encounter: Payer: Self-pay | Admitting: Internal Medicine

## 2024-07-26 VITALS — BP 130/87 | HR 96 | Temp 98.4°F | Ht 61.0 in | Wt 154.0 lb

## 2024-07-26 DIAGNOSIS — E1169 Type 2 diabetes mellitus with other specified complication: Secondary | ICD-10-CM

## 2024-07-26 DIAGNOSIS — K219 Gastro-esophageal reflux disease without esophagitis: Secondary | ICD-10-CM

## 2024-07-26 DIAGNOSIS — R0982 Postnasal drip: Secondary | ICD-10-CM | POA: Diagnosis not present

## 2024-07-26 DIAGNOSIS — Z7984 Long term (current) use of oral hypoglycemic drugs: Secondary | ICD-10-CM | POA: Diagnosis not present

## 2024-07-26 DIAGNOSIS — Z23 Encounter for immunization: Secondary | ICD-10-CM

## 2024-07-26 DIAGNOSIS — E785 Hyperlipidemia, unspecified: Secondary | ICD-10-CM

## 2024-07-26 DIAGNOSIS — E1159 Type 2 diabetes mellitus with other circulatory complications: Secondary | ICD-10-CM

## 2024-07-26 DIAGNOSIS — I152 Hypertension secondary to endocrine disorders: Secondary | ICD-10-CM

## 2024-07-26 DIAGNOSIS — I10 Essential (primary) hypertension: Secondary | ICD-10-CM | POA: Diagnosis not present

## 2024-07-26 DIAGNOSIS — E119 Type 2 diabetes mellitus without complications: Secondary | ICD-10-CM

## 2024-07-26 LAB — POCT GLYCOSYLATED HEMOGLOBIN (HGB A1C): HbA1c, POC (controlled diabetic range): 6.1 % (ref 0.0–7.0)

## 2024-07-26 LAB — GLUCOSE, POCT (MANUAL RESULT ENTRY): POC Glucose: 102 mg/dL — AB (ref 70–99)

## 2024-07-26 MED ORDER — PANTOPRAZOLE SODIUM 40 MG PO TBEC: 40.0000 mg | DELAYED_RELEASE_TABLET | Freq: Every day | ORAL | 0 refills | Status: DC

## 2024-07-26 MED ORDER — SPIRONOLACTONE 25 MG PO TABS: 12.5000 mg | ORAL_TABLET | Freq: Every day | ORAL | 3 refills | Status: AC

## 2024-07-26 NOTE — Progress Notes (Signed)
 Patient ID: Rebecca Glover, female    DOB: [birth year]  MRN: [id number]  CC: Diabetes (DM f/u. /Flu vax administered on 07/26/2024 - C.A.)   Subjective: Rebecca Glover is a 68 y.o. female who presents for chronic ds management. Her concerns today include:  Pt with hx of DM, HTN, migraine (Dr. Rush, CT with 5 mm Ca+ ? Meningioma)   Discussed the use of AI scribe software for clinical note transcription with the patient, who gave verbal consent to proceed.  History of Present Illness Rebecca Glover is a 68 year old female who presents for a follow-up on her chronic medical conditions.  She recently completed a course of antibiotics for a urinary tract infection diagnosed last month, with resolution of symptoms and a normal repeat urinalysis.  DM:  Results for orders placed or performed in visit on 07/26/24  POCT glucose (manual entry)   Collection Time: 07/26/24  1:46 PM  Result Value Ref Range   POC Glucose 102 (A) 70 - 99 mg/dl  POCT glycosylated hemoglobin (Hb A1C)   Collection Time: 07/26/24  1:48 PM  Result Value Ref Range   Hemoglobin A1C     HbA1c POC (<> result, manual entry)     HbA1c, POC (prediabetic range)     HbA1c, POC (controlled diabetic range) 6.1 0.0 - 7.0 %  Her diabetes management includes metformin  850 mg twice daily. Her last A1c was 6.1, and her blood sugar was 102 mg/dL. She does not check her blood sugars regularly but has done so occasionally. She snacks more than she should, particularly on salty and crunchy foods, and is aware of the impact of certain fruits on her blood sugar levels.  HTN:  she is on Hyzaar 100/25 mg once daily, amlodipine  10 mg daily, and propranolol  40 mg twice daily. She has not checked her blood pressure this week due to a family death and increased stress. She snacks on salty foods and has not been exercising regularly, except for taking her sister to doctor appointments. No chest pain or shortness of breath, but she experiences phlegm buildup  in her throat at night, which she can clear with coughing. She also has nasal congestion but no drainage at the back of her throat.  HL: She is on pravastatin  40 mg daily for hyperlipidemia.  GERD: She is also on pantoprazole  40 mg twice daily for acid reflux. She avoids foods that exacerbate her reflux symptoms.    Patient Active Problem List   Diagnosis Date Noted   Type 2 diabetes mellitus with hyperlipidemia (HCC) 08/18/2022   Hypertension associated with diabetes (HCC) 08/18/2022   Chronic daily headache 08/18/2022   Chronic congestion of paranasal sinus 08/18/2022   HYPERTENSION, BENIGN SYSTEMIC 11/30/2006   IRRITABLE BOWEL SYNDROME 11/30/2006     Current Outpatient Medications on File Prior to Visit  Medication Sig Dispense Refill   amLODipine  (NORVASC ) 10 MG tablet Take 1 tablet (10 mg total) by mouth daily. 90 tablet 1   Aspirin-Salicylamide-Caffeine (BC HEADACHE POWDER PO) Take 1 packet by mouth daily as needed (headaches).     losartan -hydrochlorothiazide (HYZAAR) 100-25 MG tablet Take 1 tablet by mouth daily. 90 tablet 2   metFORMIN  (GLUCOPHAGE ) 850 MG tablet Take 1 tablet (850 mg total) by mouth 2 (two) times daily with a meal. 180 tablet 2   Naphazoline HCl (CLEAR EYES OP) Apply to eye.     pravastatin  (PRAVACHOL ) 40 MG tablet Take 1 tablet (40 mg total) by mouth daily.  90 tablet 2   propranolol  (INDERAL ) 40 MG tablet TAKE 1 TABLET BY MOUTH TWICE DAILY 180 tablet 1   SUMAtriptan  (IMITREX ) 100 MG tablet Take 1 tablet (100 mg total) by mouth once as needed for up to 1 dose for migraine. May repeat in 2 hours if headache persists or recurs. (Patient not taking: Reported on 07/26/2024) 10 tablet 2   topiramate  (TOPAMAX ) 50 MG tablet Take 1 tablet (50 mg total) by mouth 2 (two) times daily. (Patient not taking: Reported on 07/26/2024) 180 tablet 3   Current Facility-Administered Medications on File Prior to Visit  Medication Dose Route Frequency Provider Last Rate Last Admin    0.9 %  sodium chloride  infusion  500 mL Intravenous Once Nandigam, Kavitha V, MD        Allergies  Allergen Reactions   Codeine Nausea Only    Social History   Socioeconomic History   Marital status: Single    Spouse name: Not on file   Number of children: Not on file   Years of education: Not on file   Highest education level: 10th grade  Occupational History   Not on file  Tobacco Use   Smoking status: Former    Current packs/day: 0.25    Average packs/day: 0.3 packs/day for 5.0 years (1.3 ttl pk-yrs)    Types: Cigarettes   Smokeless tobacco: Never  Vaping Use   Vaping status: Never Used  Substance and Sexual Activity   Alcohol use: No   Drug use: No   Sexual activity: Not Currently    Birth control/protection: None  Other Topics Concern   Not on file  Social History Narrative   Pt lives alone    Retired    Social Drivers of Corporate investment banker Strain: Low Risk  (07/26/2024)   Overall Financial Resource Strain (CARDIA)    Difficulty of Paying Living Expenses: Not hard at all  Food Insecurity: No Food Insecurity (07/26/2024)   Hunger Vital Sign    Worried About Running Out of Food in the Last Year: Never true    Ran Out of Food in the Last Year: Never true  Transportation Needs: No Transportation Needs (07/26/2024)   PRAPARE - Administrator, Civil Service (Medical): No    Lack of Transportation (Non-Medical): No  Physical Activity: Insufficiently Active (07/26/2024)   Exercise Vital Sign    Days of Exercise per Week: 2 days    Minutes of Exercise per Session: 30 min  Stress: No Stress Concern Present (07/26/2024)   [employer] of Occupational Health - Occupational Stress Questionnaire    Feeling of Stress: Not at all  Social Connections: Moderately Integrated (07/26/2024)   Social Connection and Isolation Panel    Frequency of Communication with Friends and Family: More than three times a week    Frequency of Social Gatherings  with Friends and Family: Twice a week    Attends Religious Services: More than 4 times per year    Active Member of [employer] or Organizations: Yes    Attends Banker Meetings: More than 4 times per year    Marital Status: Never married  Intimate Partner Violence: Not At Risk (07/26/2024)   Humiliation, Afraid, Rape, and Kick questionnaire    Fear of Current or Ex-Partner: No    Emotionally Abused: No    Physically Abused: No    Sexually Abused: No    Family History  Problem Relation Age of Onset   Diabetes Mother  Breast cancer Sister 62   Stroke Sister    Sleep apnea Sister    Arthritis Maternal Aunt    Sleep apnea Nephew    Colon cancer Neg Hx     Past Surgical History:  Procedure Laterality Date   ABDOMINAL HYSTERECTOMY  10/03/1980   APPENDECTOMY  10/03/1962   COLONOSCOPY  12/01/2015   polyps   COLONOSCOPY WITH ESOPHAGOGASTRODUODENOSCOPY (EGD)  01/16/2023   UPPER GASTROINTESTINAL ENDOSCOPY  10/04/2003   UPPER GASTROINTESTINAL ENDOSCOPY  10/03/1997   upper gi series    ROS: Review of Systems Negative except as stated above  PHYSICAL EXAM: BP 130/87   Pulse 96   Temp 98.4 F (36.9 C) (Oral)   Ht 5' 1 (1.549 m)   Wt 154 lb (69.9 kg)   SpO2 100%   BMI 29.10 kg/m   Wt Readings from Last 3 Encounters:  07/26/24 154 lb (69.9 kg)  06/13/24 155 lb (70.3 kg)  03/26/24 156 lb (70.8 kg)    Physical Exam  General appearance - alert, well appearing, older AAF and in no distress Mental status - normal mood, behavior, speech, dress, motor activity, and thought processes Neck - supple, no significant adenopathy Chest - clear to auscultation, no wheezes, rales or rhonchi, symmetric air entry Heart - normal rate, regular rhythm, normal S1, S2, no murmurs, rubs, clicks or gallops Extremities - peripheral pulses normal, no pedal edema, no clubbing or cyanosis      Latest Ref Rng & Units 07/27/2023   12:25 PM 08/23/2022    8:52 AM  CMP  Glucose 70 - 99  mg/dL 95  897   BUN 8 - 27 mg/dL 13  14   Creatinine 9.42 - 1.00 mg/dL 9.34  9.23   Sodium 865 - 144 mmol/L 140  141   Potassium 3.5 - 5.2 mmol/L 4.0  3.8   Chloride 96 - 106 mmol/L 99  99   CO2 20 - 29 mmol/L 22  21   Calcium 8.7 - 10.3 mg/dL 89.7  9.9   Total Protein 6.0 - 8.5 g/dL 7.5  7.6   Total Bilirubin 0.0 - 1.2 mg/dL 0.4  0.3   Alkaline Phos 44 - 121 IU/L 105  114   AST 0 - 40 IU/L 25  34   ALT 0 - 32 IU/L 26  25    Lipid Panel     Component Value Date/Time   CHOL 150 07/27/2023 1225   TRIG 117 07/27/2023 1225   HDL 49 07/27/2023 1225   CHOLHDL 3.1 07/27/2023 1225   LDLCALC 80 07/27/2023 1225    CBC    Component Value Date/Time   WBC 7.0 07/27/2023 1225   RBC 5.29 (H) 07/27/2023 1225   HGB 14.5 07/27/2023 1225   HCT 45.6 07/27/2023 1225   PLT 376 07/27/2023 1225   MCV 86 07/27/2023 1225   MCH 27.4 07/27/2023 1225   MCHC 31.8 07/27/2023 1225   RDW 13.3 07/27/2023 1225    ASSESSMENT AND PLAN: 1. Type 2 diabetes mellitus with other specified complication, without long-term current use of insulin (HCC) (Primary) At goal.  Continue metformin  850 mg twice a day. Discussed and encouraged healthy eating habits.  Went over with her fruits that have low versus high glycemic index. Encouraged her to try to get in some walking if only 3 days a week for 20 to 30 minutes. - POCT glycosylated hemoglobin (Hb A1C) - POCT glucose (manual entry) - CBC; Future - Comprehensive metabolic panel with GFR;  Future - Microalbumin / creatinine urine ratio  2. Diabetes mellitus treated with oral medication (HCC) See #1 above.  3. Hypertension associated with diabetes (HCC) Not at goal.  Continue Hyzaar 100/25 mg, Norvasc  10 mg daily and Inderal  40 mg twice a day. Add spironolactone 25 mg half tablet daily.  After being on this for 1 week, return to the lab to have chemistry checked. - spironolactone (ALDACTONE) 25 MG tablet; Take 0.5 tablets (12.5 mg total) by mouth daily.   Dispense: 45 tablet; Refill: 3  4. Hyperlipidemia associated with type 2 diabetes mellitus (HCC) Continue Pravachol  40 mg daily - Lipid panel; Future  5. Gastroesophageal reflux disease without esophagitis We discussed trying to step down pantoprazole  from twice a day to once a day.  Patient is willing to give it a try.  6. Postnasal drip Symptoms at night suggest postnasal drip.  I recommend purchasing loratadine  over-the-counter and taking at bedtime.  7. Need for influenza vaccination Given today.   Patient was given the opportunity to ask questions.  Patient verbalized understanding of the plan and was able to repeat key elements of the plan.   This documentation was completed using Paediatric nurse.  Any transcriptional errors are unintentional.  Orders Placed This Encounter  Procedures   Flu vaccine HIGH DOSE PF(Fluzone Trivalent)   CBC   Comprehensive metabolic panel with GFR   Lipid panel   Microalbumin / creatinine urine ratio   POCT glycosylated hemoglobin (Hb A1C)   POCT glucose (manual entry)     Requested Prescriptions   Signed Prescriptions Disp Refills   spironolactone (ALDACTONE) 25 MG tablet 45 tablet 3    Sig: Take 0.5 tablets (12.5 mg total) by mouth daily.   pantoprazole  (PROTONIX ) 40 MG tablet 90 tablet 0    Sig: Take 1 tablet (40 mg total) by mouth daily.    Return in about 4 months (around 11/26/2024) for LAb visit in 1 wk.  Barnie Louder, MD, FACP

## 2024-07-26 NOTE — Patient Instructions (Addendum)
  VISIT SUMMARY: Today, we reviewed your chronic medical conditions and made some adjustments to your treatment plan. Your recent urinary tract infection has resolved, and your diabetes is well-controlled. We discussed your blood pressure, cholesterol, acid reflux, and symptoms of postnasal drip.  YOUR PLAN: -HYPERTENSION: Hypertension means high blood pressure. Your blood pressure is not optimally controlled, so we are adding spironolactone, half a tablet daily, to your current medications. Please check your blood pressure at home twice a week and schedule blood tests one week after starting spironolactone to monitor potassium levels. Continue taking Hyzaar, amlodipine , and propranolol  as prescribed.   -TYPE 2 DIABETES MELLITUS: Type 2 diabetes is a condition where your body does not use insulin properly. Your diabetes is well-controlled with an A1c of 6.1. Continue taking metformin  850 mg twice daily. Please check your blood sugars three times a week and focus on dietary modifications, especially eating low glycemic index fruits like apples, berries, and firm bananas. Aim for regular exercise, 20-30 minutes, 3-4 days a week.  -HYPERLIPIDEMIA: Hyperlipidemia means high cholesterol levels. Continue taking pravastatin  40 mg daily. We will order blood tests to check your cholesterol levels.  -GASTROESOPHAGEAL REFLUX DISEASE (GERD): GERD is a condition where stomach acid frequently flows back into the tube connecting your mouth and stomach. We are reducing your pantoprazole  to 40 mg once daily to minimize long-term effects on bone health.  You have post nasal drip symptoms. Purchase Claritin  10 mg over the counter and use it daily  INSTRUCTIONS: Please check your blood pressure at home twice a week and schedule blood tests one week after starting spironolactone to monitor potassium levels. Continue taking your medications as prescribed. Check your blood sugars three times a week and focus on dietary  modifications. Aim for regular exercise, 20-30 minutes, 3-4 days a week. We will order blood tests to check your cholesterol levels.                      Contains text generated by [employer]bridge.                                 Contains text generated by Abridge.

## 2024-07-28 LAB — MICROALBUMIN / CREATININE URINE RATIO
Creatinine, Urine: 185.6 mg/dL
Microalb/Creat Ratio: 4 mg/g{creat} (ref 0–29)
Microalbumin, Urine: 7.1 ug/mL

## 2024-07-29 ENCOUNTER — Ambulatory Visit: Payer: Self-pay | Admitting: Internal Medicine

## 2024-08-02 ENCOUNTER — Ambulatory Visit: Payer: Medicare (Managed Care) | Attending: Family Medicine

## 2024-08-02 DIAGNOSIS — E785 Hyperlipidemia, unspecified: Secondary | ICD-10-CM | POA: Diagnosis not present

## 2024-08-02 DIAGNOSIS — E1169 Type 2 diabetes mellitus with other specified complication: Secondary | ICD-10-CM

## 2024-08-03 LAB — COMPREHENSIVE METABOLIC PANEL WITH GFR
ALT: 20 IU/L (ref 0–32)
AST: 19 IU/L (ref 0–40)
Albumin: 4.4 g/dL (ref 3.9–4.9)
Alkaline Phosphatase: 100 IU/L (ref 49–135)
BUN/Creatinine Ratio: 16 (ref 12–28)
BUN: 14 mg/dL (ref 8–27)
Bilirubin Total: <0.2 mg/dL (ref 0.0–1.2)
CO2: 22 mmol/L (ref 20–29)
Calcium: 9.8 mg/dL (ref 8.7–10.3)
Chloride: 100 mmol/L (ref 96–106)
Creatinine, Ser: 0.86 mg/dL (ref 0.57–1.00)
Globulin, Total: 2.9 g/dL (ref 1.5–4.5)
Glucose: 116 mg/dL — ABNORMAL HIGH (ref 70–99)
Potassium: 3.9 mmol/L (ref 3.5–5.2)
Sodium: 140 mmol/L (ref 134–144)
Total Protein: 7.3 g/dL (ref 6.0–8.5)
eGFR: 74 mL/min/1.73 (ref >59)

## 2024-08-03 LAB — CBC
Hematocrit: 45.7 % (ref 34.0–46.6)
Hemoglobin: 14.5 g/dL (ref 11.1–15.9)
MCH: 27.6 pg (ref 26.6–33.0)
MCHC: 31.7 g/dL (ref 31.5–35.7)
MCV: 87 fL (ref 79–97)
Platelets: 355 x10E3/uL (ref 150–450)
RBC: 5.25 x10E6/uL (ref 3.77–5.28)
RDW: 13.8 % (ref 11.7–15.4)
WBC: 11.9 x10E3/uL — ABNORMAL HIGH (ref 3.4–10.8)

## 2024-08-03 LAB — LIPID PANEL
Chol/HDL Ratio: 2.8 ratio (ref 0.0–4.4)
Cholesterol, Total: 130 mg/dL (ref 100–199)
HDL: 47 mg/dL (ref >39)
LDL Chol Calc (NIH): 63 mg/dL (ref 0–99)
Triglycerides: 110 mg/dL (ref 0–149)
VLDL Cholesterol Cal: 20 mg/dL (ref 5–40)

## 2024-08-06 ENCOUNTER — Ambulatory Visit: Payer: Medicare (Managed Care) | Attending: Internal Medicine

## 2024-08-06 ENCOUNTER — Telehealth: Payer: Medicare (Managed Care)

## 2024-08-06 VITALS — Ht 61.0 in | Wt 154.0 lb

## 2024-08-06 DIAGNOSIS — Z0000 Encounter for general adult medical examination without abnormal findings: Secondary | ICD-10-CM | POA: Diagnosis not present

## 2024-08-06 DIAGNOSIS — Z87891 Personal history of nicotine dependence: Secondary | ICD-10-CM | POA: Diagnosis not present

## 2024-08-06 NOTE — Progress Notes (Cosign Needed)
 Subjective:   Rebecca Glover is a 68 y.o. female who presents for a Medicare Annual Wellness Visit.  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   Past Surgical History:  Procedure Laterality Date   ABDOMINAL HYSTERECTOMY  10/03/1980   APPENDECTOMY  10/03/1962   COLONOSCOPY  12/01/2015   polyps   COLONOSCOPY WITH ESOPHAGOGASTRODUODENOSCOPY (EGD)  01/16/2023   UPPER GASTROINTESTINAL ENDOSCOPY  10/04/2003   UPPER GASTROINTESTINAL ENDOSCOPY  10/03/1997   upper gi series   Family History  Problem Relation Age of Onset   Diabetes Mother    Breast cancer Sister 47   Stroke Sister    Sleep apnea Sister    Arthritis Maternal Aunt    Sleep apnea Nephew    Colon cancer Neg Hx    Social History   Occupational History   Not on file  Tobacco Use   Smoking status: Former    Current packs/day: 0.25    Average packs/day: 0.3 packs/day for 5.0 years (1.3 ttl pk-yrs)    Types: Cigarettes   Smokeless tobacco: Never  Vaping Use   Vaping status: Never Used  Substance and Sexual Activity   Alcohol use: No   Drug use: No   Sexual activity: Not Currently    Birth control/protection: None   Tobacco Counseling Counseling given: Not Answered  SDOH Screenings   Food Insecurity: No Food Insecurity (08/06/2024)  Housing: Low Risk  (08/06/2024)  Transportation Needs: No Transportation Needs (08/06/2024)  Utilities: Not At Risk (08/06/2024)  Alcohol Screen: Low Risk  (07/26/2024)  Depression (PHQ2-9): Low Risk  (08/06/2024)  Financial Resource Strain: Low Risk  (07/26/2024)  Physical Activity: Insufficiently Active (08/06/2024)  Social Connections: Moderately Integrated (08/06/2024)  Stress: No Stress Concern Present (08/06/2024)  Tobacco Use: Medium Risk (08/06/2024)  Health Literacy:  Adequate Health Literacy (08/06/2024)   Depression Screen    08/06/2024   11:17 AM 07/26/2024    1:48 PM 06/13/2024    2:33 PM 03/26/2024    1:47 PM 03/26/2024    1:46 PM 11/27/2023   10:39 AM 07/27/2023   11:39 AM  PHQ 2/9 Scores  PHQ - 2 Score 0 0 0 0 0 0 0  PHQ- 9 Score 0 0 0 1 1 0 0     Goals Addressed             This Visit's Progress    08/06/2024: To get back to physical activity.         Visit info / Clinical Intake: Medicare Wellness Visit Type:: Subsequent Annual Wellness Visit Medicare Wellness Visit Mode:: Telephone If telephone:: video declined If telephone or video:: pt reported vitals Interpreter Needed?: No Pre-visit prep was completed: yes AWV questionnaire completed by patient prior to visit?: no Living arrangements:: (!) lives alone Patient's Overall Health Status Rating: (!) fair Typical amount of pain: none Does pain affect daily life?: no Are you currently prescribed opioids?: no  Dietary Habits and Nutritional Risks How many meals a day?: 2 Eats fruit and vegetables daily?: yes Most meals are obtained by: preparing own meals In the last 2 weeks, have you had any of the following?: (!) nausea, vomiting, diarrhea (LACTOSE INTOLERANCE DUE TO MILK PRODUCTS)  Functional Status Activities of Daily Living (to include ambulation/medication): Independent Ambulation: Independent  Medication Administration: Independent Home Management: Independent Manage your own finances?: yes Primary transportation is: driving Concerns about vision?: no *vision screening is required for WTM* Concerns about hearing?: no  Fall Screening Falls in the past year?: 0 Number of falls in past year: 0 Was there an injury with Fall?: 0 Fall Risk Category Calculator: 0 Patient Fall Risk Level: Low Fall Risk  Fall Risk Patient at Risk for Falls Due to: No Fall Risks Fall risk Follow up: Falls evaluation completed; Education provided  Home and Transportation Safety: All  rugs have non-skid backing?: N/A, no rugs All stairs or steps have railings?: N/A, no stairs Grab bars in the bathtub or shower?: yes Have non-skid surface in bathtub or shower?: yes Good home lighting?: yes Regular seat belt use?: yes Hospital stays in the last year:: no  Cognitive Assessment Difficulty concentrating, remembering, or making decisions? : no Will 6CIT or Mini Cog be Completed: no 6CIT or Mini Cog Declined: patient alert, oriented, able to answer questions appropriately and recall recent events  Advance Directives (For Healthcare) Does Patient Have a Medical Advance Directive?: No Would patient like information on creating a medical advance directive?: Yes (MAU/Ambulatory/Procedural Areas - Information given)  Reviewed/Updated  Reviewed/Updated: All        Objective:    Today's Vitals   08/06/24 1115  Weight: 154 lb (69.9 kg)  Height: 5' 1 (1.549 m)  PainSc: 0-No pain   Body mass index is 29.1 kg/m.  Current Medications (verified) Outpatient Encounter Medications as of 08/06/2024  Medication Sig   amLODipine  (NORVASC ) 10 MG tablet Take 1 tablet (10 mg total) by mouth daily.   Aspirin-Salicylamide-Caffeine (BC HEADACHE POWDER PO) Take 1 packet by mouth daily as needed (headaches).   losartan -hydrochlorothiazide (HYZAAR) 100-25 MG tablet Take 1 tablet by mouth daily.   metFORMIN  (GLUCOPHAGE ) 850 MG tablet Take 1 tablet (850 mg total) by mouth 2 (two) times daily with a meal.   Naphazoline HCl (CLEAR EYES OP) Apply to eye.   pantoprazole  (PROTONIX ) 40 MG tablet Take 1 tablet (40 mg total) by mouth daily.   pravastatin  (PRAVACHOL ) 40 MG tablet Take 1 tablet (40 mg total) by mouth daily.   propranolol  (INDERAL ) 40 MG tablet TAKE 1 TABLET BY MOUTH TWICE DAILY   spironolactone (ALDACTONE) 25 MG tablet Take 0.5 tablets (12.5 mg total) by mouth daily.   SUMAtriptan  (IMITREX ) 100 MG tablet Take 1 tablet (100 mg total) by mouth once as needed for up to 1 dose for  migraine. May repeat in 2 hours if headache persists or recurs. (Patient not taking: Reported on 07/26/2024)   topiramate  (TOPAMAX ) 50 MG tablet Take 1 tablet (50 mg total) by mouth 2 (two) times daily. (Patient not taking: Reported on 07/26/2024)   Facility-Administered Encounter Medications as of 08/06/2024  Medication   0.9 %  sodium chloride  infusion   Hearing/Vision screen Hearing Screening - Comments:: Denies hearing difficulties.  Vision Screening - Comments:: Wears rx glasses - up to date with routine eye exams with  [facility] Immunizations and Health Maintenance Health Maintenance  Topic Date Due   Hepatitis C Screening  Never done   OPHTHALMOLOGY EXAM  05/28/2024   COVID-19 Vaccine (4 - 2025-26 season) 06/03/2024   HEMOGLOBIN A1C  01/24/2025   FOOT EXAM  03/26/2025   Diabetic kidney evaluation - Urine ACR  07/26/2025   Diabetic kidney evaluation - eGFR measurement  08/02/2025   Medicare Annual Wellness (AWV)  08/06/2025   Colonoscopy  01/15/2026   Mammogram  07/19/2026   DTaP/Tdap/Td (3 - Td or Tdap) 07/26/2033   Pneumococcal Vaccine: 50+ Years  Completed   Influenza Vaccine  Completed   DEXA SCAN  Completed   Zoster Vaccines- Shingrix   Completed   Meningococcal B Vaccine  Aged Out        Assessment/Plan:  This is a routine wellness examination for Rebecca Glover.  Patient Care Team: Vicci Barnie NOVAK, MD as PCP - General (Internal Medicine) [facility] as Consulting Physician [facility])  I have personally reviewed and noted the following in the patient's chart:   Medical and social history Use of alcohol, tobacco or illicit drugs  Current medications and supplements including opioid prescriptions. Functional ability and status Nutritional status Physical activity Advanced directives List of other physicians Hospitalizations, surgeries, and ER visits in previous 12 months Vitals Screenings to include cognitive, depression, and  falls Referrals and appointments  No orders of the defined types were placed in this encounter.  In addition, I have reviewed and discussed with patient certain preventive protocols, quality metrics, and best practice recommendations. A written personalized care plan for preventive services as well as general preventive health recommendations were provided to patient.   Rebecca LOISE Fuller, LPN   [id number]/01/[id number]   Return in about 1 year (around 08/06/2025) for Medicare wellness.  After Visit Summary: (MyChart) Due to this being a telephonic visit, the after visit summary with patients personalized plan was offered to patient via MyChart   Nurse Notes: None at this time.

## 2024-08-06 NOTE — Patient Instructions (Signed)
 Ms. Fesperman,  Thank you for taking the time for your Medicare Wellness Visit. I appreciate your continued commitment to your health goals. Please review the care plan we discussed, and feel free to reach out if I can assist you further.  Please note that Annual Wellness Visits do not include a physical exam. Some assessments may be limited, especially if the visit was conducted virtually. If needed, we may recommend an in-person follow-up with your provider.  Ongoing Care Seeing your primary care provider every 3 to 6 months helps us  monitor your health and provide consistent, personalized care.   Referrals If a referral was made during today's visit and you haven't received any updates within two weeks, please contact the referred provider directly to check on the status.  Recommended Screenings:  Health Maintenance  Topic Date Due   Hepatitis C Screening  Never done   Eye exam for diabetics  05/28/2024   COVID-19 Vaccine (4 - 2025-26 season) 06/03/2024   Hemoglobin A1C  01/24/2025   Complete foot exam   03/26/2025   Yearly kidney health urinalysis for diabetes  07/26/2025   Yearly kidney function blood test for diabetes  08/02/2025   Medicare Annual Wellness Visit  08/06/2025   Colon Cancer Screening  01/15/2026   Breast Cancer Screening  07/19/2026   DTaP/Tdap/Td vaccine (3 - Td or Tdap) 07/26/2033   Pneumococcal Vaccine for age over 47  Completed   Flu Shot  Completed   DEXA scan (bone density measurement)  Completed   Zoster (Shingles) Vaccine  Completed   Meningitis B Vaccine  Aged Out       08/06/2024   11:18 AM  Advanced Directives  Does Patient Have a Medical Advance Directive? No  Would patient like information on creating a medical advance directive? Yes (MAU/Ambulatory/Procedural Areas - Information given)    Vision: Annual vision screenings are recommended for early detection of glaucoma, cataracts, and diabetic retinopathy. These exams can also reveal signs of  chronic conditions such as diabetes and high blood pressure.  Dental: Annual dental screenings help detect early signs of oral cancer, gum disease, and other conditions linked to overall health, including heart disease and diabetes.  Please see the attached documents for additional preventive care recommendations.

## 2024-08-08 ENCOUNTER — Telehealth: Payer: Self-pay | Admitting: Internal Medicine

## 2024-08-08 NOTE — Telephone Encounter (Signed)
 Copied from CRM [id number]. Topic: General - Billing Inquiry >> Aug 07, 2024  2:46 PM Alfonso ORN wrote:  Reason for CRM: Pt calling to see if insurance [facility] (formally [facility]) is accepted for dr. Vicci specifically. Advised for pt to call insurance for confirmation but she was told to contact clinic for confirmation. Please advise

## 2024-08-08 NOTE — Telephone Encounter (Signed)
 Contacted patient and informed her that we have the [facility] card on file and it shows as active. Advised patient to bring the physical card to the office so we can verify the insurance in the system. Patient acknowledged.

## 2024-08-27 ENCOUNTER — Other Ambulatory Visit: Payer: Self-pay | Admitting: Internal Medicine

## 2024-09-04 ENCOUNTER — Telehealth: Payer: Self-pay | Admitting: Family Medicine

## 2024-09-04 NOTE — Telephone Encounter (Signed)
 Pt called to request to cancel appt  due to Insurance   appt Canceled

## 2024-09-11 ENCOUNTER — Ambulatory Visit: Payer: Medicare (Managed Care) | Admitting: Family Medicine

## 2024-10-21 ENCOUNTER — Other Ambulatory Visit: Payer: Self-pay | Admitting: Internal Medicine

## 2024-10-21 DIAGNOSIS — K219 Gastro-esophageal reflux disease without esophagitis: Secondary | ICD-10-CM

## 2024-11-26 ENCOUNTER — Ambulatory Visit: Payer: Self-pay | Admitting: Internal Medicine
